# Patient Record
Sex: Male | Born: 1950 | Race: White | Hispanic: No | Smoking: Former smoker
Health system: Southern US, Community
[De-identification: ages and names within clinical notes are randomized; demographics above are authoritative.]

## PROBLEM LIST (undated history)

## (undated) DIAGNOSIS — K219 Gastro-esophageal reflux disease without esophagitis: Secondary | ICD-10-CM

## (undated) DIAGNOSIS — E785 Hyperlipidemia, unspecified: Secondary | ICD-10-CM

## (undated) DIAGNOSIS — I10 Essential (primary) hypertension: Secondary | ICD-10-CM

## (undated) DIAGNOSIS — E119 Type 2 diabetes mellitus without complications: Secondary | ICD-10-CM

## (undated) DIAGNOSIS — R7303 Prediabetes: Secondary | ICD-10-CM

## (undated) HISTORY — PX: COLONOSCOPY: SHX174

## (undated) HISTORY — PX: JOINT REPLACEMENT: SHX530

## (undated) HISTORY — PX: SHOULDER SURGERY: SHX246

## (undated) HISTORY — PX: ESOPHAGOGASTRODUODENOSCOPY: SHX1529

## (undated) HISTORY — PX: FRACTURE SURGERY: SHX138

---
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
## 2006-10-05 ENCOUNTER — Ambulatory Visit: Payer: Self-pay

## 2006-11-02 ENCOUNTER — Ambulatory Visit: Payer: Self-pay | Admitting: Orthopaedic Surgery

## 2012-04-02 ENCOUNTER — Emergency Department: Payer: Self-pay | Admitting: Emergency Medicine

## 2012-04-14 ENCOUNTER — Emergency Department: Payer: Self-pay | Admitting: Emergency Medicine

## 2013-10-12 ENCOUNTER — Ambulatory Visit: Payer: Self-pay | Admitting: Gastroenterology

## 2013-10-29 ENCOUNTER — Ambulatory Visit: Payer: Self-pay | Admitting: Gastroenterology

## 2013-12-27 HISTORY — PX: JOINT REPLACEMENT: SHX530

## 2014-01-25 ENCOUNTER — Ambulatory Visit: Payer: Self-pay | Admitting: Gastroenterology

## 2014-01-30 LAB — PATHOLOGY REPORT

## 2014-03-27 ENCOUNTER — Ambulatory Visit: Payer: Self-pay | Admitting: Orthopedic Surgery

## 2014-03-27 DIAGNOSIS — I10 Essential (primary) hypertension: Secondary | ICD-10-CM

## 2014-03-27 LAB — MRSA PCR SCREENING

## 2014-04-11 ENCOUNTER — Inpatient Hospital Stay: Payer: Self-pay | Admitting: Orthopedic Surgery

## 2014-04-12 LAB — BASIC METABOLIC PANEL
Anion Gap: 4 — ABNORMAL LOW (ref 7–16)
BUN: 12 mg/dL (ref 7–18)
CO2: 29 mmol/L (ref 21–32)
CREATININE: 1.11 mg/dL (ref 0.60–1.30)
Calcium, Total: 7.9 mg/dL — ABNORMAL LOW (ref 8.5–10.1)
Chloride: 100 mmol/L (ref 98–107)
EGFR (Non-African Amer.): >60
GLUCOSE: 148 mg/dL — AB (ref 65–99)
Osmolality: 269 (ref 275–301)
POTASSIUM: 3.7 mmol/L (ref 3.5–5.1)
SODIUM: 133 mmol/L — AB (ref 136–145)

## 2014-04-12 LAB — PLATELET COUNT: Platelet: 204 10*3/uL (ref 150–440)

## 2014-04-12 LAB — HEMOGLOBIN: HGB: 11.9 g/dL — AB (ref 13.0–18.0)

## 2014-04-13 LAB — BASIC METABOLIC PANEL
Anion Gap: 3 — ABNORMAL LOW (ref 7–16)
BUN: 16 mg/dL (ref 7–18)
CREATININE: 1.01 mg/dL (ref 0.60–1.30)
Calcium, Total: 7.8 mg/dL — ABNORMAL LOW (ref 8.5–10.1)
Chloride: 103 mmol/L (ref 98–107)
Co2: 31 mmol/L (ref 21–32)
EGFR (African American): >60
EGFR (Non-African Amer.): >60
GLUCOSE: 121 mg/dL — AB (ref 65–99)
OSMOLALITY: 276 (ref 275–301)
POTASSIUM: 3.9 mmol/L (ref 3.5–5.1)
Sodium: 137 mmol/L (ref 136–145)

## 2014-04-13 LAB — HEMOGLOBIN: HGB: 11.5 g/dL — ABNORMAL LOW (ref 13.0–18.0)

## 2014-04-17 LAB — PATHOLOGY REPORT

## 2015-04-19 NOTE — Discharge Summary (Signed)
PATIENT NAME:  Mark CavalierGRAHAM, Jabier M MR#:  [id number] DATE OF BIRTH:  February 18, 1951  DATE OF ADMISSION:  04/11/2014 DATE OF DISCHARGE:    ADMITTING DIAGNOSIS: Degenerative arthrosis of the right hip.   DISCHARGE DIAGNOSIS: Degenerative arthrosis of the right hip.   HISTORY OF PRESENT ILLNESS: The patient is a 64 year old who has been followed at [facility] for progression of right hip and groin pain. The patient had been having problems sleeping. Was having problems getting in and out of his car. He states that the pain had increased to the point that it was significantly interfering with his activities of daily living. [facility] showed severe arthritic changes to the right hip with collapse of the articular space. After discussion of the risks and benefits of surgical intervention, the patient expressed his understanding of the risks and benefits and agreed for plans for surgical intervention.   PROCEDURE: Right anterior total hip arthroplasty.   ANESTHESIA: Spinal.   IMPLANTS UTILIZED: Medacta AMIS 3-stem, 58 Versafit cup DM with liner, and a 38 mm femoral head.   HOSPITAL COURSE: The patient tolerated the procedure very well. He had no complications. He was then taken to the PAC-U, where he was stabilized and then transferred to the orthopedic floor. The patient began receiving anticoagulation therapy with Lovenox 30 mg subcu q.12 h. per anesthesia and pharmacy protocol. He was fitted with TED stockings bilaterally. These were allowed to be removed one hour per eight hour shift. He was also fitted with the AV acupressure foot pumps bilaterally, set at 80 mmHg. His calves have been nontender. There has been no evidence of any DVTs. Negative Homans sign. Heels were elevated off the bed using rolled towels.   The patient has denied any chest pain or shortness of breath. Vital signs have been stable. He has been afebrile. Hemodynamically, he was stable. No transfusions  were given.   Physical therapy was initiated on day 1 for gait training and transfers. Upon being discharged, he was ambulating greater than 340 feet. Was able go up and down four steps. He was independent with bed-to-chair transfers. Occupational therapy was also initiated on day 1 for activities of daily living and assistive devices.   The patient's IV, Foley and Hemovac were discontinued on day 2 along with the dressing change. The wound was free of any drainage or signs of infection.   DISPOSITION: The patient is being discharged to home in improved, stable condition. He will continue weight-bearing as tolerated. Anterior hip precautions were gone over. I recommend he continues rolled towels under his heels. Continue TED stockings. These are allowed to be removed at night, but are to be worn during the day. Recommend that he continue with incentive spirometer q.1 h. while awake, encourage cough, deep breathing q.2 h. while awake. He is placed on a regular diet. Staples will be removed on 04/25/2014, and apply Benzoin and Steri-Strips. He will need to see me in the office in six weeks. Call the clinic sooner for any temperatures of 101.5 or greater or excessive bleeding.   The patient is to resume his regular medications that he was on prior to admission. He was given a prescription for oxycodone 5 to 10 mg hours p.r.n. for pain, Ultram 50, 100 mg q.4-6 h. p.r.n. for pain and Lovenox 40 mg subcutaneously daily for 14 days, and then discontinue, begin taking one 81 mg enteric-coated aspirin unless contraindicated.   DRUG ALLERGIES: No known drug allergies.   PAST  MEDICAL HISTORY: Hypertension, hypercholesterolemia.     ____________________________ Van Clines, PA jrw:cg D: 04/14/2014 07:43:57 ET T: 04/14/2014 08:04:08 ET JOB#: [id number]  cc: Van Clines, PA, <Dictator> Sunya Humbarger PA ELECTRONICALLY SIGNED 04/19/2014 8:18

## 2015-04-19 NOTE — Op Note (Signed)
PATIENT NAME:  Mark Fisher, Mark Fisher MR#:  [id number] DATE OF BIRTH:  April 01, 1951  DATE OF PROCEDURE:  04/11/2014  PREOPERATIVE DIAGNOSIS: Right hip osteoarthritis.   POSTOPERATIVE DIAGNOSIS: Right hip osteoarthritis.  PROCEDURE: Right total hip replacement.   ANESTHESIA: Spinal.   SURGEON: Kennedy BuckerMichael Ashyia Schraeder, Fisher.D.   ASSISTANT: Dedra Skeensodd Mundy, PA-C.   DESCRIPTION OF PROCEDURE: The patient was brought to the Operating Room, and after spinal anesthesia was obtained, the patient was placed on the operative table with the Mitek attachment to the right foot and the left leg in a well-padded table. After getting  the C-arm in and getting good visualization of the hip. The hip was prepped and draped in the usual sterile fashion, appropriate patient identification, timeout procedures were completed. An anterior approach was made. Direct anterior hip approach using the AMIS technique with the incision centered over the greater trochanter and tensor fascia lata muscle. The TFL fascia was incised and the muscle retracted laterally. The lateral circumflex vessels were cauterized and the anterior hip capsule exposed with a flap created with the anterior capsule. The femoral neck cut was then carried out more vertical than usual because of the patient's inherent anatomy short varus femoral neck. After the neck cut was created. The head was removed and it showed complete loss of articular cartilage on the medial half of the head. The labrum was excised and sequential reaming was carried out to 58 mm, at which point, there was good bleeding bone around the periphery. A 58 mm trial fit well and a 58 cup was impacted in the appropriate position. With external rotation the ischiofemoral and pubofemoral ligaments were released to allow for adequate mobilization. The leg was dropped into extension. Sequential broaching was carried out to a #3, which gave a good fill. With trials appropriate leg length was determined, and the final  components selected. A 3 Amis stem was impacted and trial with a medium head was performed. Comparing this to preop x-rays it showed slightly longer hip as desired and the hip final components were assembled. After thorough irrigation of the hip, the hip was reduced, stable to 90 degrees external rotation test. The deep fascia was repaired using a heavy quill suture. A subcutaneous  drain placed, followed by the 2-0 quill subcutaneously and skin staples. Xeroform, 4 x 4's, ABD and tape applied. The patient was sent to recovery room in stable condition. There was 2 Hemovac  drains placed subcutaneously.   COMPLICATIONS: None.   SPECIMENS: The femoral head.   IMPLANTS: Medacta Amis 3 stem, 58 Versafit cup DM with liner, and 28 mm femoral head.     ____________________________ Leitha SchullerMichael J. Kwesi Sangha, MD mjm:sg D: 04/11/2014 09:46:01 ET T: 04/11/2014 10:23:25 ET JOB#: [id number]  cc: Leitha SchullerMichael J. Oneal Biglow, MD, <Dictator> Leitha SchullerMICHAEL J Dick Hark MD ELECTRONICALLY SIGNED 04/11/2014 13:56

## 2016-04-14 IMAGING — CR RIGHT HIP - COMPLETE 2+ VIEW
1 series · 2 of 2 positions shown · non-contrast
Comparison: 04/11/2014

CLINICAL DATA: Postop for total hip replacement

EXAM:
RIGHT HIP - COMPLETE 2+ VIEW

[Series 1: ap · 0.17mm/px · 2 of 2 slices shown]
[im 1/2]
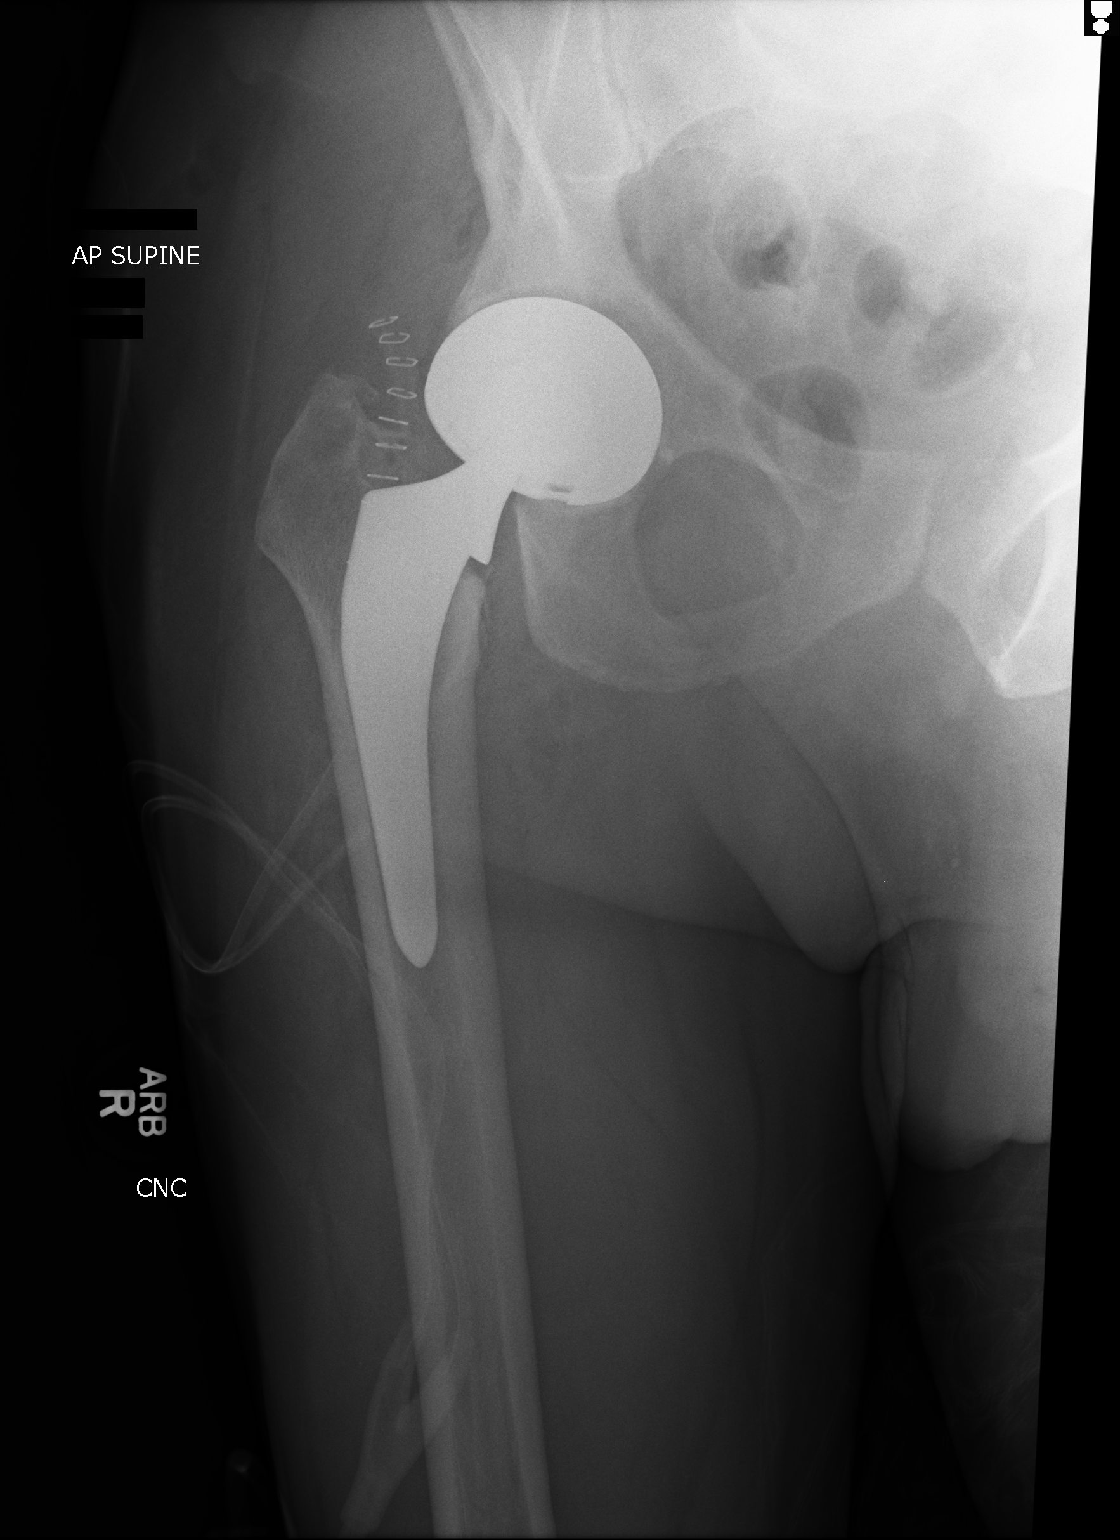
[im 2/2]
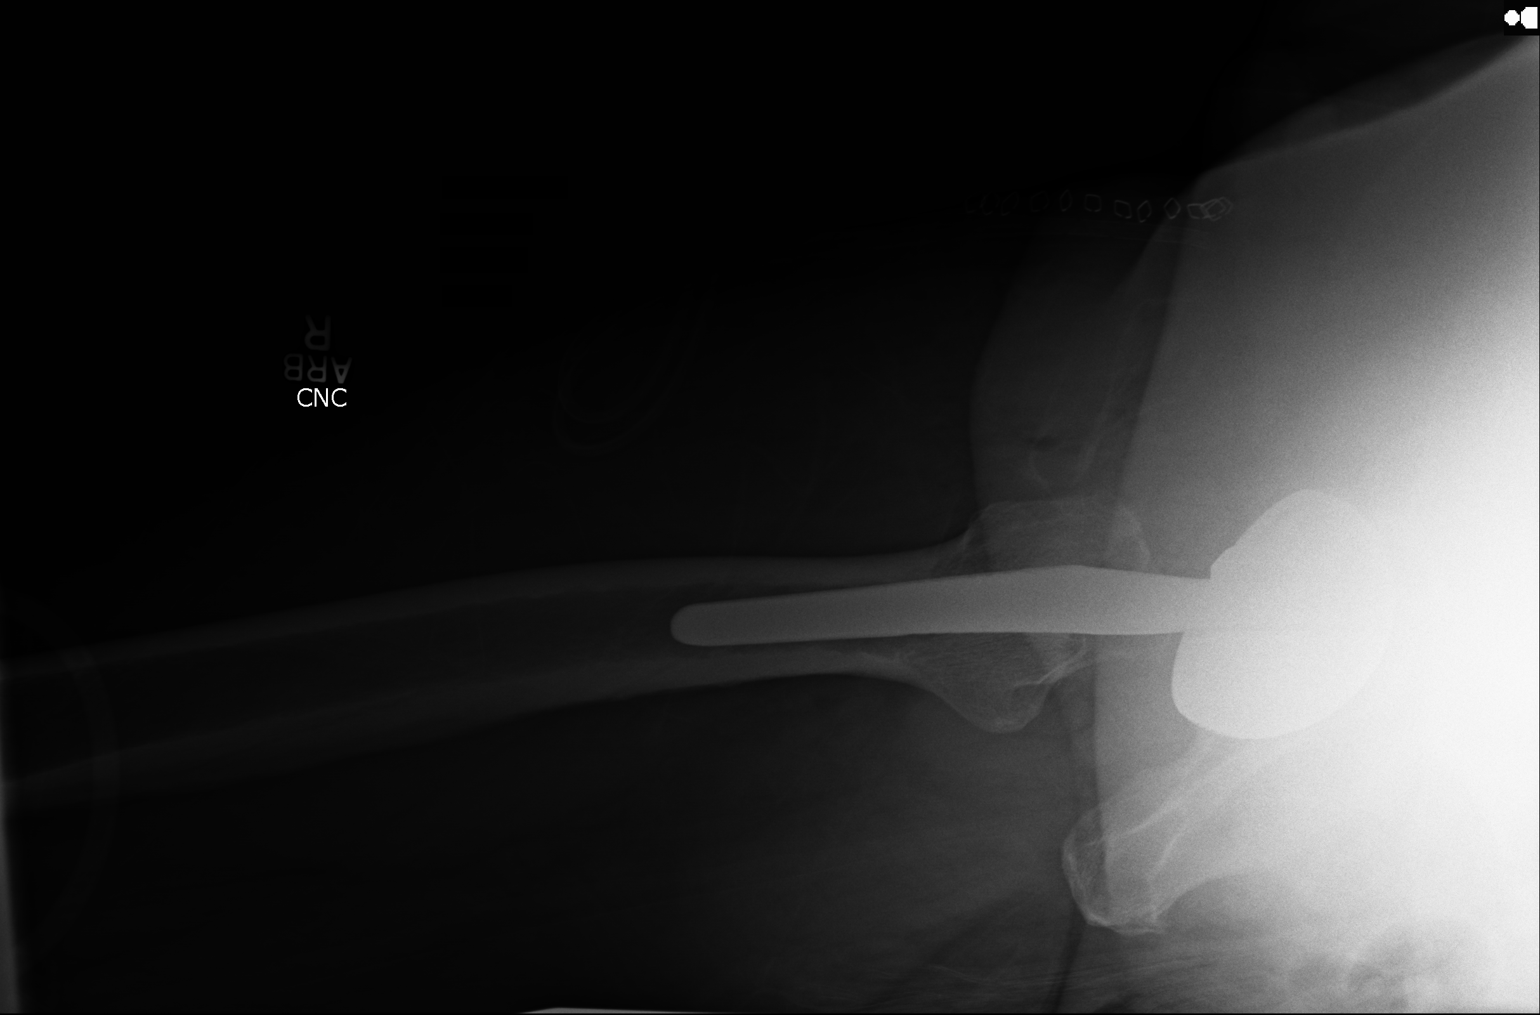

[2 of 2 positions shown; findings below may reference images not displayed]

FINDINGS: Two views of the right hip submitted. There is right hip prosthesis
in anatomic alignment. Postsurgical changes are noted with lateral
skin staple. Surgical drain is in place.
IMPRESSION: Right hip prosthesis in anatomic alignment. Postsurgical changes are
noted.

## 2017-01-20 ENCOUNTER — Other Ambulatory Visit: Payer: Self-pay | Admitting: Orthopedic Surgery

## 2017-01-20 DIAGNOSIS — M25512 Pain in left shoulder: Secondary | ICD-10-CM

## 2017-01-20 DIAGNOSIS — M25312 Other instability, left shoulder: Secondary | ICD-10-CM

## 2017-01-20 DIAGNOSIS — S46002A Unspecified injury of muscle(s) and tendon(s) of the rotator cuff of left shoulder, initial encounter: Secondary | ICD-10-CM

## 2017-01-26 ENCOUNTER — Ambulatory Visit
Admission: RE | Admit: 2017-01-26 | Discharge: 2017-01-26 | Disposition: A | Discharge status: Home / Self Care | Payer: 59 | Source: Ambulatory Visit | Attending: Orthopedic Surgery | Admitting: Orthopedic Surgery

## 2017-01-26 DIAGNOSIS — M25412 Effusion, left shoulder: Secondary | ICD-10-CM | POA: Diagnosis not present

## 2017-01-26 DIAGNOSIS — Z8781 Personal history of (healed) traumatic fracture: Secondary | ICD-10-CM | POA: Diagnosis not present

## 2017-01-26 DIAGNOSIS — Z9889 Other specified postprocedural states: Secondary | ICD-10-CM | POA: Insufficient documentation

## 2017-01-26 DIAGNOSIS — M75122 Complete rotator cuff tear or rupture of left shoulder, not specified as traumatic: Secondary | ICD-10-CM | POA: Insufficient documentation

## 2017-01-26 DIAGNOSIS — X58XXXA Exposure to other specified factors, initial encounter: Secondary | ICD-10-CM | POA: Diagnosis not present

## 2017-01-26 DIAGNOSIS — M25312 Other instability, left shoulder: Secondary | ICD-10-CM | POA: Diagnosis not present

## 2017-01-26 DIAGNOSIS — M25512 Pain in left shoulder: Secondary | ICD-10-CM | POA: Diagnosis present

## 2017-01-26 DIAGNOSIS — S46002A Unspecified injury of muscle(s) and tendon(s) of the rotator cuff of left shoulder, initial encounter: Secondary | ICD-10-CM

## 2017-02-16 ENCOUNTER — Encounter
Admission: RE | Admit: 2017-02-16 | Discharge: 2017-02-16 | Disposition: A | Discharge status: Home / Self Care | Payer: 59 | Source: Ambulatory Visit | Attending: Surgery | Admitting: Surgery

## 2017-02-16 DIAGNOSIS — Z01810 Encounter for preprocedural cardiovascular examination: Secondary | ICD-10-CM | POA: Insufficient documentation

## 2017-02-16 DIAGNOSIS — Z01812 Encounter for preprocedural laboratory examination: Secondary | ICD-10-CM

## 2017-02-16 DIAGNOSIS — E785 Hyperlipidemia, unspecified: Secondary | ICD-10-CM

## 2017-02-16 DIAGNOSIS — I10 Essential (primary) hypertension: Secondary | ICD-10-CM

## 2017-02-16 HISTORY — DX: Prediabetes: R73.03

## 2017-02-16 HISTORY — DX: Essential (primary) hypertension: I10

## 2017-02-16 HISTORY — DX: Hyperlipidemia, unspecified: E78.5

## 2017-02-16 HISTORY — DX: Gastro-esophageal reflux disease without esophagitis: K21.9

## 2017-02-16 LAB — URINALYSIS, COMPLETE (UACMP) WITH MICROSCOPIC
BACTERIA UA: NONE SEEN
BILIRUBIN URINE: NEGATIVE
Glucose, UA: NEGATIVE mg/dL
HGB URINE DIPSTICK: NEGATIVE
KETONES UR: NEGATIVE mg/dL
LEUKOCYTES UA: NEGATIVE
NITRITE: NEGATIVE
PROTEIN: NEGATIVE mg/dL
SQUAMOUS EPITHELIAL / LPF: NONE SEEN
Specific Gravity, Urine: 1.01 (ref 1.005–1.030)
pH: 5 (ref 5.0–8.0)

## 2017-02-16 LAB — TYPE AND SCREEN
ABO/RH(D): A POS
Antibody Screen: NEGATIVE

## 2017-02-16 LAB — BASIC METABOLIC PANEL
Anion gap: 6 (ref 5–15)
BUN: 15 mg/dL (ref 6–20)
CO2: 27 mmol/L (ref 22–32)
CREATININE: 0.88 mg/dL (ref 0.61–1.24)
Calcium: 9.1 mg/dL (ref 8.9–10.3)
Chloride: 106 mmol/L (ref 101–111)
Glucose, Bld: 92 mg/dL (ref 65–99)
POTASSIUM: 3.9 mmol/L (ref 3.5–5.1)
SODIUM: 139 mmol/L (ref 135–145)

## 2017-02-16 LAB — PROTIME-INR
INR: 1.03
PROTHROMBIN TIME: 13.5 s (ref 11.4–15.2)

## 2017-02-16 MED ORDER — CEFAZOLIN SODIUM-DEXTROSE 2-4 GM/100ML-% IV SOLN
2.0000 g | Freq: Once | INTRAVENOUS | Status: AC
  Administered 2017-02-17: 2 g via INTRAVENOUS

## 2017-02-16 NOTE — Patient Instructions (Signed)
Your procedure is scheduled on: Thursday 02/17/17 Report to DAY SURGERY. 2ND FLOOR MEDICAL MALL ENTRANCE. 10:30 arrival   Remember: Instructions that are not followed completely may result in serious medical risk, up to and including death, or upon the discretion of your surgeon and anesthesiologist your surgery may need to be rescheduled.    __X__ 1. Do not eat food or drink liquids after midnight. No gum chewing or hard candies.     __X__ 2. No Alcohol for 24 hours before or after surgery.   ____ 3. Bring all medications with you on the day of surgery if instructed.    __X__ 4. Notify your doctor if there is any change in your medical condition     (cold, fever, infections).             ___x__5. No smoking within 24 hours of your surgery.     Do not wear jewelry, make-up, hairpins, clips or nail polish.  Do not wear lotions, powders, or perfumes.   Do not shave 48 hours prior to surgery. Men may shave face and neck.  Do not bring valuables to the hospital.    [facility] not responsible for any belongings or valuables.               Contacts, dentures or bridgework may not be worn into surgery.  Leave your suitcase in the car. After surgery it may be brought to your room.  For patients admitted to the hospital, discharge time is determined by your                treatment team.   Patients discharged the day of surgery will not be allowed to drive home.   Please read over the following fact sheets that you were given:   MRSA Information   __x__ Take these medicines the morning of surgery with A SIP OF WATER:    1. protonix  2.   3.   4.  5.  6.  ____ Fleet Enema (as directed)   __x__ Use CHG Soap as directed  ____ Use inhalers on the day of surgery  ____ Stop metformin 2 days prior to surgery    ____ Take 1/2 of usual insulin dose the night before surgery and none on the morning of surgery.   ____ Stop Coumadin/Plavix/aspirin on   __X__ Stop Anti-inflammatories  such as Advil, Aleve, Ibuprofen, Motrin, Naproxen, Naprosyn, Goodies,powder, or aspirin products.  OK to take Tylenol.   ____ Stop supplements until after surgery.    ____ Bring C-Pap to the hospital.

## 2017-02-17 ENCOUNTER — Ambulatory Visit: Payer: 59 | Admitting: Anesthesiology

## 2017-02-17 ENCOUNTER — Inpatient Hospital Stay
Admission: AD | Admit: 2017-02-17 | Discharge: 2017-02-18 | DRG: 483 | Disposition: A | Discharge status: Home Health | Payer: 59 | Source: Ambulatory Visit | Attending: Surgery | Admitting: Surgery

## 2017-02-17 ENCOUNTER — Inpatient Hospital Stay: Payer: 59

## 2017-02-17 ENCOUNTER — Encounter
Admission: AD | Disposition: A | Discharge status: Home Health | Payer: Self-pay | Source: Ambulatory Visit | Attending: Surgery

## 2017-02-17 DIAGNOSIS — Z79899 Other long term (current) drug therapy: Secondary | ICD-10-CM | POA: Diagnosis not present

## 2017-02-17 DIAGNOSIS — R7303 Prediabetes: Secondary | ICD-10-CM | POA: Diagnosis present

## 2017-02-17 DIAGNOSIS — M75102 Unspecified rotator cuff tear or rupture of left shoulder, not specified as traumatic: Principal | ICD-10-CM | POA: Diagnosis present

## 2017-02-17 DIAGNOSIS — M12812 Other specific arthropathies, not elsewhere classified, left shoulder: Secondary | ICD-10-CM | POA: Diagnosis present

## 2017-02-17 DIAGNOSIS — K219 Gastro-esophageal reflux disease without esophagitis: Secondary | ICD-10-CM | POA: Diagnosis present

## 2017-02-17 DIAGNOSIS — E785 Hyperlipidemia, unspecified: Secondary | ICD-10-CM | POA: Diagnosis present

## 2017-02-17 DIAGNOSIS — I10 Essential (primary) hypertension: Secondary | ICD-10-CM | POA: Diagnosis present

## 2017-02-17 DIAGNOSIS — Z87891 Personal history of nicotine dependence: Secondary | ICD-10-CM | POA: Diagnosis not present

## 2017-02-17 DIAGNOSIS — Z7982 Long term (current) use of aspirin: Secondary | ICD-10-CM

## 2017-02-17 DIAGNOSIS — Z96612 Presence of left artificial shoulder joint: Secondary | ICD-10-CM

## 2017-02-17 DIAGNOSIS — Z96641 Presence of right artificial hip joint: Secondary | ICD-10-CM | POA: Diagnosis present

## 2017-02-17 HISTORY — PX: REVERSE SHOULDER ARTHROPLASTY: SHX5054

## 2017-02-17 LAB — URINE CULTURE: CULTURE: NO GROWTH

## 2017-02-17 LAB — SURGICAL PCR SCREEN
MRSA, PCR: NEGATIVE
Staphylococcus aureus: NEGATIVE

## 2017-02-17 LAB — GLUCOSE, CAPILLARY: Glucose-Capillary: 111 mg/dL — ABNORMAL HIGH (ref 65–99)

## 2017-02-17 LAB — ABO/RH: ABO/RH(D): A POS

## 2017-02-17 SURGERY — ARTHROPLASTY, SHOULDER, TOTAL, REVERSE
Anesthesia: Regional | Site: Shoulder | Laterality: Left | Wound class: Clean

## 2017-02-17 MED ORDER — ONDANSETRON HCL 4 MG/2ML IJ SOLN
INTRAMUSCULAR | Status: AC
  Filled 2017-02-17: qty 2

## 2017-02-17 MED ORDER — ACETAMINOPHEN NICU IV SYRINGE 10 MG/ML
INTRAVENOUS | Status: AC
  Filled 2017-02-17: qty 1

## 2017-02-17 MED ORDER — DOCUSATE SODIUM 100 MG PO CAPS
100.0000 mg | ORAL_CAPSULE | Freq: Two times a day (BID) | ORAL | Status: DC
  Administered 2017-02-17 – 2017-02-18 (×2): 100 mg via ORAL
  Filled 2017-02-17 (×2): qty 1

## 2017-02-17 MED ORDER — BUPIVACAINE-EPINEPHRINE (PF) 0.5% -1:200000 IJ SOLN
INTRAMUSCULAR | Status: AC
  Filled 2017-02-17: qty 30

## 2017-02-17 MED ORDER — ACETAMINOPHEN 500 MG PO TABS
1000.0000 mg | ORAL_TABLET | Freq: Four times a day (QID) | ORAL | Status: DC
  Administered 2017-02-17 – 2017-02-18 (×3): 1000 mg via ORAL
  Filled 2017-02-17 (×3): qty 2

## 2017-02-17 MED ORDER — SUGAMMADEX SODIUM 200 MG/2ML IV SOLN
INTRAVENOUS | Status: DC | PRN
  Administered 2017-02-17: 180 mg via INTRAVENOUS

## 2017-02-17 MED ORDER — BISACODYL 10 MG RE SUPP: 10.0000 mg | Freq: Every day | RECTAL | Status: DC | PRN

## 2017-02-17 MED ORDER — EPHEDRINE SULFATE 50 MG/ML IJ SOLN
INTRAMUSCULAR | Status: DC | PRN
  Administered 2017-02-17 (×3): 5 mg via INTRAVENOUS

## 2017-02-17 MED ORDER — MIDAZOLAM HCL 2 MG/2ML IJ SOLN
INTRAMUSCULAR | Status: AC
  Administered 2017-02-17: 1 mg via INTRAVENOUS
  Filled 2017-02-17: qty 2

## 2017-02-17 MED ORDER — NEOMYCIN-POLYMYXIN B GU 40-200000 IR SOLN
Status: DC | PRN
  Administered 2017-02-17: 16 mL

## 2017-02-17 MED ORDER — ACETAMINOPHEN 325 MG PO TABS: 650.0000 mg | ORAL_TABLET | Freq: Four times a day (QID) | ORAL | Status: DC | PRN

## 2017-02-17 MED ORDER — SODIUM CHLORIDE 0.9 % IJ SOLN
INTRAMUSCULAR | Status: AC
  Filled 2017-02-17: qty 50

## 2017-02-17 MED ORDER — SUGAMMADEX SODIUM 200 MG/2ML IV SOLN
INTRAVENOUS | Status: AC
  Filled 2017-02-17: qty 2

## 2017-02-17 MED ORDER — ROPIVACAINE HCL 5 MG/ML IJ SOLN
INTRAMUSCULAR | Status: DC | PRN
  Administered 2017-02-17: 30 mL via PERINEURAL

## 2017-02-17 MED ORDER — ONDANSETRON HCL 4 MG PO TABS: 4.0000 mg | ORAL_TABLET | Freq: Four times a day (QID) | ORAL | Status: DC | PRN

## 2017-02-17 MED ORDER — PROPOFOL 10 MG/ML IV BOLUS
INTRAVENOUS | Status: AC
  Filled 2017-02-17: qty 20

## 2017-02-17 MED ORDER — LISINOPRIL-HYDROCHLOROTHIAZIDE 20-25 MG PO TABS: 1.0000 | ORAL_TABLET | Freq: Every day | ORAL | Status: DC

## 2017-02-17 MED ORDER — EPINEPHRINE PF 1 MG/ML IJ SOLN
INTRAMUSCULAR | Status: AC
  Filled 2017-02-17: qty 2

## 2017-02-17 MED ORDER — ACETAMINOPHEN 10 MG/ML IV SOLN
INTRAVENOUS | Status: DC | PRN
  Administered 2017-02-17: 1000 mg via INTRAVENOUS

## 2017-02-17 MED ORDER — KETOROLAC TROMETHAMINE 15 MG/ML IJ SOLN
15.0000 mg | Freq: Four times a day (QID) | INTRAMUSCULAR | Status: DC
  Administered 2017-02-17 – 2017-02-18 (×2): 15 mg via INTRAVENOUS
  Filled 2017-02-17 (×3): qty 1

## 2017-02-17 MED ORDER — LIDOCAINE HCL (PF) 1 % IJ SOLN
INTRAMUSCULAR | Status: DC | PRN
  Administered 2017-02-17: 5 mL via SUBCUTANEOUS

## 2017-02-17 MED ORDER — FLEET ENEMA 7-19 GM/118ML RE ENEM: 1.0000 | ENEMA | Freq: Once | RECTAL | Status: DC | PRN

## 2017-02-17 MED ORDER — TRANEXAMIC ACID 1000 MG/10ML IV SOLN
INTRAVENOUS | Status: AC
  Filled 2017-02-17: qty 10

## 2017-02-17 MED ORDER — CEFAZOLIN SODIUM-DEXTROSE 2-3 GM-% IV SOLR
2.0000 g | Freq: Four times a day (QID) | INTRAVENOUS | Status: AC
  Administered 2017-02-17 – 2017-02-18 (×3): 2 g via INTRAVENOUS
  Filled 2017-02-17 (×3): qty 50

## 2017-02-17 MED ORDER — MIDAZOLAM HCL 2 MG/2ML IJ SOLN
INTRAMUSCULAR | Status: AC
  Filled 2017-02-17: qty 2

## 2017-02-17 MED ORDER — LISINOPRIL 20 MG PO TABS
20.0000 mg | ORAL_TABLET | Freq: Every day | ORAL | Status: DC
  Administered 2017-02-17 – 2017-02-18 (×2): 20 mg via ORAL
  Filled 2017-02-17 (×3): qty 1

## 2017-02-17 MED ORDER — ROCURONIUM BROMIDE 50 MG/5ML IV SOLN
INTRAVENOUS | Status: AC
  Filled 2017-02-17: qty 1

## 2017-02-17 MED ORDER — ROCURONIUM BROMIDE 100 MG/10ML IV SOLN
INTRAVENOUS | Status: DC | PRN
  Administered 2017-02-17: 10 mg via INTRAVENOUS
  Administered 2017-02-17: 40 mg via INTRAVENOUS
  Administered 2017-02-17: 10 mg via INTRAVENOUS

## 2017-02-17 MED ORDER — CEFAZOLIN SODIUM-DEXTROSE 2-4 GM/100ML-% IV SOLN: 2.0000 g | Freq: Four times a day (QID) | INTRAVENOUS | Status: DC

## 2017-02-17 MED ORDER — HYDROCHLOROTHIAZIDE 25 MG PO TABS
25.0000 mg | ORAL_TABLET | Freq: Every day | ORAL | Status: DC
  Administered 2017-02-17 – 2017-02-18 (×2): 25 mg via ORAL
  Filled 2017-02-17 (×2): qty 1

## 2017-02-17 MED ORDER — DEXAMETHASONE SODIUM PHOSPHATE 10 MG/ML IJ SOLN
INTRAMUSCULAR | Status: DC | PRN
  Administered 2017-02-17: 10 mg via INTRAVENOUS

## 2017-02-17 MED ORDER — SODIUM CHLORIDE 0.9 % IV SOLN
INTRAVENOUS | Status: DC | PRN
  Administered 2017-02-17: 20 ug/min via INTRAVENOUS

## 2017-02-17 MED ORDER — OXYCODONE HCL 5 MG PO TABS: 5.0000 mg | ORAL_TABLET | ORAL | Status: DC | PRN

## 2017-02-17 MED ORDER — DIPHENHYDRAMINE HCL 12.5 MG/5ML PO ELIX: 12.5000 mg | ORAL_SOLUTION | ORAL | Status: DC | PRN

## 2017-02-17 MED ORDER — HYDROMORPHONE HCL 1 MG/ML IJ SOLN
1.0000 mg | INTRAMUSCULAR | Status: DC | PRN
  Administered 2017-02-17: 1 mg via INTRAVENOUS
  Filled 2017-02-17: qty 1

## 2017-02-17 MED ORDER — TRANEXAMIC ACID 1000 MG/10ML IV SOLN
INTRAVENOUS | Status: DC | PRN
  Administered 2017-02-17: 1000 mg

## 2017-02-17 MED ORDER — SODIUM CHLORIDE 0.9 % IV SOLN
INTRAVENOUS | Status: DC
  Administered 2017-02-17: 11:00:00 via INTRAVENOUS

## 2017-02-17 MED ORDER — ASPIRIN EC 81 MG PO TBEC
81.0000 mg | DELAYED_RELEASE_TABLET | Freq: Every day | ORAL | Status: DC
  Administered 2017-02-18: 81 mg via ORAL
  Filled 2017-02-17: qty 1

## 2017-02-17 MED ORDER — PRAVASTATIN SODIUM 20 MG PO TABS
20.0000 mg | ORAL_TABLET | Freq: Every day | ORAL | Status: DC
  Administered 2017-02-17: 20 mg via ORAL
  Filled 2017-02-17: qty 1

## 2017-02-17 MED ORDER — ACETAMINOPHEN 650 MG RE SUPP: 650.0000 mg | Freq: Four times a day (QID) | RECTAL | Status: DC | PRN

## 2017-02-17 MED ORDER — LIDOCAINE HCL (PF) 2 % IJ SOLN
INTRAMUSCULAR | Status: AC
  Filled 2017-02-17: qty 2

## 2017-02-17 MED ORDER — ONDANSETRON HCL 4 MG/2ML IJ SOLN
INTRAMUSCULAR | Status: DC | PRN
  Administered 2017-02-17: 4 mg via INTRAVENOUS

## 2017-02-17 MED ORDER — FENTANYL CITRATE (PF) 100 MCG/2ML IJ SOLN
INTRAMUSCULAR | Status: AC
  Filled 2017-02-17: qty 2

## 2017-02-17 MED ORDER — DEXAMETHASONE SODIUM PHOSPHATE 10 MG/ML IJ SOLN
INTRAMUSCULAR | Status: AC
  Filled 2017-02-17: qty 1

## 2017-02-17 MED ORDER — NEOMYCIN-POLYMYXIN B GU 40-200000 IR SOLN
Status: AC
  Filled 2017-02-17: qty 20

## 2017-02-17 MED ORDER — FENTANYL CITRATE (PF) 100 MCG/2ML IJ SOLN
INTRAMUSCULAR | Status: DC | PRN
  Administered 2017-02-17 (×4): 50 ug via INTRAVENOUS

## 2017-02-17 MED ORDER — MIDAZOLAM HCL 2 MG/2ML IJ SOLN
INTRAMUSCULAR | Status: DC | PRN
  Administered 2017-02-17: 2 mg via INTRAVENOUS

## 2017-02-17 MED ORDER — PHENYLEPHRINE HCL 10 MG/ML IJ SOLN
INTRAMUSCULAR | Status: DC | PRN
  Administered 2017-02-17: 150 ug via INTRAVENOUS
  Administered 2017-02-17 (×2): 100 ug via INTRAVENOUS

## 2017-02-17 MED ORDER — PROPOFOL 10 MG/ML IV BOLUS
INTRAVENOUS | Status: DC | PRN
  Administered 2017-02-17: 200 mg via INTRAVENOUS

## 2017-02-17 MED ORDER — PANTOPRAZOLE SODIUM 40 MG PO TBEC
40.0000 mg | DELAYED_RELEASE_TABLET | Freq: Every day | ORAL | Status: DC
  Administered 2017-02-18: 40 mg via ORAL
  Filled 2017-02-17: qty 1

## 2017-02-17 MED ORDER — FENTANYL CITRATE (PF) 100 MCG/2ML IJ SOLN
INTRAMUSCULAR | Status: AC
  Administered 2017-02-17: 50 ug via INTRAVENOUS
  Filled 2017-02-17: qty 2

## 2017-02-17 MED ORDER — ONDANSETRON HCL 4 MG/2ML IJ SOLN: 4.0000 mg | Freq: Four times a day (QID) | INTRAMUSCULAR | Status: DC | PRN

## 2017-02-17 MED ORDER — MAGNESIUM HYDROXIDE 400 MG/5ML PO SUSP: 30.0000 mL | Freq: Every day | ORAL | Status: DC | PRN

## 2017-02-17 MED ORDER — FENTANYL CITRATE (PF) 100 MCG/2ML IJ SOLN
50.0000 ug | Freq: Once | INTRAMUSCULAR | Status: AC
  Administered 2017-02-17: 50 ug via INTRAVENOUS

## 2017-02-17 MED ORDER — KETOROLAC TROMETHAMINE 30 MG/ML IJ SOLN: 30.0000 mg | Freq: Once | INTRAMUSCULAR | Status: DC

## 2017-02-17 MED ORDER — METOCLOPRAMIDE HCL 10 MG PO TABS: 5.0000 mg | ORAL_TABLET | Freq: Three times a day (TID) | ORAL | Status: DC | PRN

## 2017-02-17 MED ORDER — CEFAZOLIN SODIUM-DEXTROSE 2-4 GM/100ML-% IV SOLN
INTRAVENOUS | Status: AC
  Filled 2017-02-17: qty 100

## 2017-02-17 MED ORDER — BUPIVACAINE-EPINEPHRINE (PF) 0.5% -1:200000 IJ SOLN
INTRAMUSCULAR | Status: DC | PRN
  Administered 2017-02-17: 30 mL

## 2017-02-17 MED ORDER — FENTANYL CITRATE (PF) 100 MCG/2ML IJ SOLN: 25.0000 ug | INTRAMUSCULAR | Status: DC | PRN

## 2017-02-17 MED ORDER — MIDAZOLAM HCL 2 MG/2ML IJ SOLN
1.0000 mg | Freq: Once | INTRAMUSCULAR | Status: AC
  Administered 2017-02-17: 1 mg via INTRAVENOUS

## 2017-02-17 MED ORDER — ROPIVACAINE HCL 5 MG/ML IJ SOLN
INTRAMUSCULAR | Status: AC
  Filled 2017-02-17: qty 40

## 2017-02-17 MED ORDER — ENOXAPARIN SODIUM 40 MG/0.4ML ~~LOC~~ SOLN
40.0000 mg | SUBCUTANEOUS | Status: DC
  Administered 2017-02-18: 40 mg via SUBCUTANEOUS
  Filled 2017-02-17: qty 0.4

## 2017-02-17 MED ORDER — BUPIVACAINE LIPOSOME 1.3 % IJ SUSP
INTRAMUSCULAR | Status: AC
  Filled 2017-02-17: qty 20

## 2017-02-17 MED ORDER — ONDANSETRON HCL 4 MG/2ML IJ SOLN: 4.0000 mg | Freq: Once | INTRAMUSCULAR | Status: DC | PRN

## 2017-02-17 MED ORDER — LIDOCAINE HCL (CARDIAC) 20 MG/ML IV SOLN
INTRAVENOUS | Status: DC | PRN
  Administered 2017-02-17: 80 mg via INTRAVENOUS

## 2017-02-17 MED ORDER — LIDOCAINE HCL (PF) 1 % IJ SOLN
INTRAMUSCULAR | Status: AC
  Filled 2017-02-17: qty 5

## 2017-02-17 MED ORDER — METOCLOPRAMIDE HCL 5 MG/ML IJ SOLN: 5.0000 mg | Freq: Three times a day (TID) | INTRAMUSCULAR | Status: DC | PRN

## 2017-02-17 MED ORDER — SODIUM CHLORIDE 0.9 % IV SOLN
INTRAVENOUS | Status: DC | PRN
  Administered 2017-02-17: 60 mL

## 2017-02-17 MED ORDER — SODIUM CHLORIDE 0.9 % IJ SOLN: INTRAMUSCULAR | Status: DC | PRN

## 2017-02-17 MED ORDER — KCL IN DEXTROSE-NACL 20-5-0.9 MEQ/L-%-% IV SOLN
INTRAVENOUS | Status: DC
  Administered 2017-02-17 – 2017-02-18 (×2): via INTRAVENOUS
  Filled 2017-02-17 (×4): qty 1000

## 2017-02-17 SURGICAL SUPPLY — 60 items
BIT DRILL TWIST 2.7 (BIT) IMPLANT
BLADE SAGITTAL WIDE XTHICK NO (BLADE) ×2 IMPLANT
CANISTER SUCT 1200ML W/VALVE (MISCELLANEOUS) ×2 IMPLANT
CANISTER SUCT 3000ML PPV (MISCELLANEOUS) ×4 IMPLANT
CAPT SHLDR REVTOTAL 2 ×2 IMPLANT
CATH TRAY METER 16FR LF (MISCELLANEOUS) ×2 IMPLANT
CHLORAPREP W/TINT 26ML (MISCELLANEOUS) ×2 IMPLANT
COOLER POLAR GLACIER W/PUMP (MISCELLANEOUS) ×2 IMPLANT
CRADLE LAMINECT ARM (MISCELLANEOUS) ×2 IMPLANT
DRAPE IMP U-DRAPE 54X76 (DRAPES) ×4 IMPLANT
DRAPE INCISE IOBAN 66X45 STRL (DRAPES) ×4 IMPLANT
DRAPE INCISE IOBAN 66X60 STRL (DRAPES) ×2 IMPLANT
DRAPE SHEET LG 3/4 BI-LAMINATE (DRAPES) ×4 IMPLANT
DRAPE TABLE BACK 80X90 (DRAPES) ×2 IMPLANT
DRSG OPSITE POSTOP 4X8 (GAUZE/BANDAGES/DRESSINGS) ×2 IMPLANT
ELECT CAUTERY BLADE 6.4 (BLADE) ×2 IMPLANT
GLOVE BIO SURGEON STRL SZ7.5 (GLOVE) ×8 IMPLANT
GLOVE BIO SURGEON STRL SZ8 (GLOVE) ×8 IMPLANT
GLOVE BIOGEL PI IND STRL 8 (GLOVE) ×5 IMPLANT
GLOVE BIOGEL PI INDICATOR 8 (GLOVE) ×5
GLOVE INDICATOR 8.0 STRL GRN (GLOVE) ×2 IMPLANT
GOWN STRL REUS W/ TWL LRG LVL3 (GOWN DISPOSABLE) ×1 IMPLANT
GOWN STRL REUS W/ TWL XL LVL3 (GOWN DISPOSABLE) ×1 IMPLANT
GOWN STRL REUS W/TWL LRG LVL3 (GOWN DISPOSABLE) ×1
GOWN STRL REUS W/TWL XL LVL3 (GOWN DISPOSABLE) ×1
HANDPIECE INTERPULSE COAX TIP (DISPOSABLE) ×1
HOOD PEEL AWAY FLYTE STAYCOOL (MISCELLANEOUS) ×6 IMPLANT
KIT RM TURNOVER STRD PROC AR (KITS) ×2 IMPLANT
KIT STABILIZATION SHOULDER (MISCELLANEOUS) ×2 IMPLANT
MASK FACE SPIDER DISP (MASK) ×2 IMPLANT
NDL MAYO CATGUT SZ1 (NEEDLE) ×2
NDL MAYO CATGUT SZ5 (NEEDLE) ×1
NDL SAFETY 22GX1.5 (NEEDLE) ×2 IMPLANT
NDL SUT 5 .5 CRC TPR PNT MAYO (NEEDLE) ×1 IMPLANT
NEEDLE 18GX1X1/2 (RX/OR ONLY) (NEEDLE) ×2 IMPLANT
NEEDLE HYPO 25X1 1.5 SAFETY (NEEDLE) ×2 IMPLANT
NEEDLE MAYO CATGUT SZ1 (NEEDLE) ×1 IMPLANT
NEEDLE MAYO CATGUT SZ4 (NEEDLE) ×2 IMPLANT
NEEDLE SPNL 20GX3.5 QUINCKE YW (NEEDLE) ×2 IMPLANT
NS IRRIG 1000ML POUR BTL (IV SOLUTION) ×2 IMPLANT
PACK ARTHROSCOPY SHOULDER (MISCELLANEOUS) ×2 IMPLANT
PAD WRAPON POLAR SHDR UNIV (MISCELLANEOUS) ×1 IMPLANT
PIN THREADED REVERSE (PIN) IMPLANT
SET HNDPC FAN SPRY TIP SCT (DISPOSABLE) ×1 IMPLANT
SLING ULTRA II M (MISCELLANEOUS) ×2 IMPLANT
SOL .9 NS 3000ML IRR  AL (IV SOLUTION) ×1
SOL .9 NS 3000ML IRR UROMATIC (IV SOLUTION) ×1 IMPLANT
SPONGE LAP 18X18 5 PK (GAUZE/BANDAGES/DRESSINGS) ×2 IMPLANT
STAPLER SKIN PROX 35W (STAPLE) ×2 IMPLANT
SUT ETHIBOND 0 MO6 C/R (SUTURE) ×2 IMPLANT
SUT ETHIBOND NAB CT1 #1 30IN (SUTURE) ×2 IMPLANT
SUT FIBERWIRE #2 38 BLUE 1/2 (SUTURE) ×8
SUT VIC AB 0 CT1 36 (SUTURE) ×4 IMPLANT
SUT VIC AB 2-0 CT1 27 (SUTURE) ×2
SUT VIC AB 2-0 CT1 TAPERPNT 27 (SUTURE) ×2 IMPLANT
SUTURE FIBERWR #2 38 BLUE 1/2 (SUTURE) ×4 IMPLANT
SYR 30ML LL (SYRINGE) ×4 IMPLANT
SYRINGE 10CC LL (SYRINGE) ×2 IMPLANT
TUBE CONNECTING 6X3/16 (MISCELLANEOUS) ×2 IMPLANT
WRAPON POLAR PAD SHDR UNIV (MISCELLANEOUS) ×2

## 2017-02-17 NOTE — Anesthesia Procedure Notes (Signed)
Procedure Name: Intubation Date/Time: 02/17/2017 1:39 PM Performed by: Hedda Slade Pre-anesthesia Checklist: Patient identified, Patient being monitored, Timeout performed, Emergency Drugs available and Suction available Patient Re-evaluated:Patient Re-evaluated prior to inductionOxygen Delivery Method: Circle system utilized Preoxygenation: Pre-oxygenation with 100% oxygen Intubation Type: IV induction Ventilation: Mask ventilation without difficulty Laryngoscope Size: Mac and 4 Grade View: Grade I Tube type: Oral Tube size: 7.5 mm Number of attempts: 1 Airway Equipment and Method: Stylet Placement Confirmation: ETT inserted through vocal cords under direct vision,  positive ETCO2 and breath sounds checked- equal and bilateral Secured at: 22 cm Tube secured with: Tape Dental Injury: Teeth and Oropharynx as per pre-operative assessment

## 2017-02-17 NOTE — Anesthesia Preprocedure Evaluation (Signed)
Anesthesia Evaluation  Patient identified by MRN, date of birth, ID band Patient awake    Reviewed: Allergy & Precautions, H&P , NPO status , Patient's Chart, lab work & pertinent test results, reviewed documented beta blocker date and time   History of Anesthesia Complications Negative for: history of anesthetic complications  Airway Mallampati: I  TM Distance: >3 FB Neck ROM: full    Dental  (+) Caps, Teeth Intact   Pulmonary neg pulmonary ROS, former smoker,           Cardiovascular Exercise Tolerance: Good hypertension, On Medications (-) angina(-) CAD, (-) Past MI, (-) Cardiac Stents and (-) CABG (-) dysrhythmias (-) Valvular Problems/Murmurs     Neuro/Psych negative neurological ROS  negative psych ROS   GI/Hepatic Neg liver ROS, GERD  Medicated and Controlled,  Endo/Other  diabetes (Borderline)  Renal/GU negative Renal ROS  negative genitourinary   Musculoskeletal   Abdominal   Peds  Hematology negative hematology ROS (+)   Anesthesia Other Findings Past Medical History: No date: GERD (gastroesophageal reflux disease) No date: HLD (hyperlipidemia) No date: Hypertension No date: Pre-diabetes   Reproductive/Obstetrics negative OB ROS                             Anesthesia Physical Anesthesia Plan  ASA: II  Anesthesia Plan: General and Regional   Post-op Pain Management: GA combined w/ Regional for post-op pain   Induction:   Airway Management Planned:   Additional Equipment:   Intra-op Plan:   Post-operative Plan:   Informed Consent: I have reviewed the patients History and Physical, chart, labs and discussed the procedure including the risks, benefits and alternatives for the proposed anesthesia with the patient or authorized representative who has indicated his/her understanding and acceptance.   Dental Advisory Given  Plan Discussed with: Anesthesiologist, CRNA  and Surgeon  Anesthesia Plan Comments:         Anesthesia Quick Evaluation

## 2017-02-17 NOTE — H&P (Signed)
Paper H&P to be scanned into permanent record. H&P reviewed. No changes. 

## 2017-02-17 NOTE — Op Note (Signed)
02/17/2017  3:36 PM  Patient:   Mark MUNDORF Sr.  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  Post-Op Diagnosis:   Same.  Procedure:   Reverse left total shoulder arthroplasty.  Surgeon:   Maryagnes Amos, MD  Assistant:   Horris Latino, PA-C  Anesthesia:   General endotracheal with an interscalene block placed preoperatively by the anesthesiologist.  Findings:   As above.  Complications:   None  EBL:  150 cc  Fluids:   1000 cc crystalloid  UOP:  None  TT:   None  Drains:   None  Closure:   Staples  Implants:   All press-fit Biomet Comprehensive system with a #8 mini-humeral stem, a 44 mm humeral tray with a standard insert, and a mini-base plate with a 36 mm glenosphere.  Brief Clinical Note:   The patient is a 66 year old male who is now 10 years status post a rotator cuff repair with persistent symptoms and weakness who apparently fell onto his left side several months ago. Since this fall, he has been unable to raise his arm even to shoulder level. His symptoms have persisted despite medications, activity modification, etc. His history and examination consistent with a massive rotator cuff tear. An MRI scan confirmed the presence of a massive irreparable rotator cuff tear with early cuff arthropathy. The patient presents at this time for a reverse left total shoulder arthroplasty.  Procedure:   The patient was brought into the operating room and lain in the supine position on the OR table. After adequate IV sedation was achieved, an interscalene block was placed preoperatively by the anesthesiologist. The patient then underwent general endotracheal intubation and anesthesia before the patient was repositioned in the beach chair position using the beach chair positioner. The left shoulder and upper extremity were prepped with ChloraPrep solution before being draped sterilely. Preoperative antibiotics were administered. A  standard anterior approach to the shoulder was made through an approximately 4-5 inch incision. The incision was carried down through the subcutaneous tissues to expose the deltopectoral fascia. The interval between the deltoid and pectoralis muscles was identified and this plane developed, retracting the cephalic vein laterally with the deltoid muscle. The conjoined tendon was identified. Its lateral margin was dissected and the Kolbel self-retraining retractor inserted. The "three sisters" were identified and cauterized. Bursal tissues were removed to improve visualization. The remaining inferior portion of the subscapularis tendon was released from its attachment to the lesser tuberosity 1 cm proximal to its insertion and several tagging sutures placed. The inferior capsule was released with care after identifying and protecting the axillary nerve. The proximal humeral cut was made at approximately 30 of retroversion using the extra-medullary guide.   Attention was redirected to the glenoid. The labrum was debrided circumferentially before the center of the glenoid was marked with electrocautery. The guidewire was drilled into the glenoid neck using the appropriate guide. After verifying its position, it was overreamed with the mini-baseplate reamer to create a flat surface. The permanent mini-baseplate was impacted into place. It was stabilized with a 25 x 6.5 mm central screw and four peripheral screws. Locking screws were placed superiorly and inferiorly while nonlocking screws were placed anteriorly and posteriorly. The permanent 36 mm glenosphere was then impacted into place and its Morse taper locking mechanism verified using manual distraction.  Attention was directed to the humeral side. The humeral canal was reamed sequentially beginning with the end-cutting reamer then progressing from a  4 mm reamer up to an 8 mm reamer. This provided excellent circumferential chatter. The canal was broached  beginning with a #6 broach and progressing to a #8 broach. This was left in place and a trial reduction performed using the standard trial humeral platform. The arm demonstrated excellent range of motion as the hand could be brought across the chest to the opposite shoulder and brought to the top of the patient's head and to the patient's ear. The shoulder appeared stable throughout this range of motion. The joint was dislocated and the trial components removed. The permanent #8 mini-stem was impacted into place with care taken to maintain the appropriate version. The permanent 44 mm humeral platform with the standard insert was put together on the back table and impacted into place. Again, the [facility] taper locking mechanism was verified using manual distraction. The shoulder was relocated using two finger pressure and again placed through a range of motion with the findings as described above.  The wound was copiously irrigated with bacitracin saline solution using the jet lavage system before a total of 20 cc of Exparel diluted out to 60 cc with normal saline and 30 cc of 0.5% Sensorcaine with epinephrine was injected into the pericapsular and peri-incisional tissues to help with postoperative analgesia. The subscapularis tendon was reapproximated using #2 FiberWire interrupted sutures. The deltopectoral interval was closed using #0 Vicryl interrupted sutures before the subcutaneous tissues were closed using 2-0 Vicryl interrupted sutures. The skin was closed using staples. Prior to closing the skin, 1 g of transexemic acid in 10 cc of normal saline was injected intra-articularly to help with postoperative bleeding. A sterile occlusive dressing was applied to the wound before the arm was placed into a shoulder immobilizer with an abduction pillow. A Polar Care system also was applied to the shoulder. The patient was then transferred back to a hospital bed before being awakened, extubated, and returned to the  recovery room in satisfactory condition after tolerating the procedure well.

## 2017-02-17 NOTE — Anesthesia Procedure Notes (Signed)
Anesthesia Regional Block: Interscalene brachial plexus block   Pre-Anesthetic Checklist: ,, timeout performed, Correct Patient, Correct Site, Correct Laterality, Correct Procedure, Correct Position, site marked, Risks and benefits discussed,  Surgical consent,  Pre-op evaluation,  At surgeon's request and post-op pain management  Laterality: Left and Upper  Prep: chloraprep       Needles:  Injection technique: Single-shot  Needle Type: Stimiplex     Needle Length: 5cm  Needle Gauge: 22     Additional Needles:   Procedures: ultrasound guided,,,,,,,,  Narrative:  Start time: 02/17/2017 1:07 PM End time: 02/17/2017 1:13 PM Injection made incrementally with aspirations every 5 mL.  Performed by: Personally  Anesthesiologist: Lenard SimmerKARENZ, Sydna Brodowski  Additional Notes: Functioning IV was confirmed and monitors were applied.  A 50mm 22ga Stimuplex needle was used. Sterile prep and drape,hand hygiene and sterile gloves were used.  Negative aspiration and negative test dose prior to incremental administration of local anesthetic. The patient tolerated the procedure well.

## 2017-02-17 NOTE — OR Nursing (Signed)
Patient transferred to PACU for block.  Report given to Tommi EmeryE Sharpe RN

## 2017-02-17 NOTE — Anesthesia Post-op Follow-up Note (Cosign Needed)
Anesthesia QCDR form completed.        

## 2017-02-17 NOTE — Transfer of Care (Signed)
Immediate Anesthesia Transfer of Care Note  Patient: Mark CavalierRandy M Belvedere Sr.  Procedure(s) Performed: Procedure(s): REVERSE SHOULDER ARTHROPLASTY (Left)  Patient Location: PACU  Anesthesia Type:General  Level of Consciousness: sedated  Airway & Oxygen Therapy: Patient Spontanous Breathing and Patient connected to face mask oxygen  Post-op Assessment: Report given to RN and Post -op Vital signs reviewed and stable  Post vital signs: Reviewed and stable  Last Vitals:  Vitals:   02/17/17 1315 02/17/17 1544  BP: (!) 150/100 (!) 147/83  Pulse: 74 80  Resp: 18 13  Temp:  36.3 C    Last Pain:  Vitals:   02/17/17 1544  TempSrc: Tympanic         Complications: No apparent anesthesia complications

## 2017-02-17 NOTE — Anesthesia Postprocedure Evaluation (Signed)
Anesthesia Post Note  Patient: Stasia CavalierRandy M Vitullo Sr.  Procedure(s) Performed: Procedure(s) (LRB): REVERSE SHOULDER ARTHROPLASTY (Left)  Patient location during evaluation: PACU Anesthesia Type: Regional Level of consciousness: awake and alert Pain management: pain level controlled Vital Signs Assessment: post-procedure vital signs reviewed and stable Respiratory status: spontaneous breathing, nonlabored ventilation, respiratory function stable and patient connected to nasal cannula oxygen Cardiovascular status: blood pressure returned to baseline and stable Postop Assessment: no signs of nausea or vomiting Anesthetic complications: no     Last Vitals:  Vitals:   02/17/17 1829 02/17/17 1848  BP: (!) 144/98 (!) 149/84  Pulse:  81  Resp:  16  Temp:      Last Pain:  Vitals:   02/17/17 1837  TempSrc:   PainSc: 0-No pain                 Fareeha Evon S

## 2017-02-18 ENCOUNTER — Encounter: Payer: Self-pay | Admitting: Surgery

## 2017-02-18 MED ORDER — OXYCODONE HCL 5 MG PO TABS: 5.0000 mg | ORAL_TABLET | ORAL | 0 refills | Status: DC | PRN

## 2017-02-18 NOTE — Discharge Instructions (Signed)
Diet: As you were doing prior to hospitalization   Shower:  May shower but keep the wounds dry, use an occlusive plastic wrap, NO SOAKING IN TUB.  If the bandage gets wet, change with a clean dry gauze.  Dressing:  You may change your dressing as needed. Change the dressing with sterile gauze dressing.    Activity:  Increase activity slowly as tolerated, but follow the weight bearing instructions below.  No lifting or driving for 6 weeks.  Weight Bearing:   Non-weightbearing to the left arm.  To prevent constipation: you may use a stool softener such as -  Colace (over the counter) 100 mg by mouth twice a day  Drink plenty of fluids (prune juice may be helpful) and high fiber foods Miralax (over the counter) for constipation as needed.    Itching:  If you experience itching with your medications, try taking only a single pain pill, or even half a pain pill at a time.  You may take up to 10 pain pills per day, and you can also use benadryl over the counter for itching or also to help with sleep.   Precautions:  If you experience chest pain or shortness of breath - call 911 immediately for transfer to the hospital emergency department!!  If you develop a fever greater that 101 F, purulent drainage from wound, increased redness or drainage from wound, or calf pain-[facility].  DVT Prophylaxis: ASA 81mg  twice daily for DVT prophylaxis.                                               Follow- Up Appointment:  Please call for an appointment to be [facility] weeks [facility]

## 2017-02-18 NOTE — Discharge Summary (Signed)
Physician Discharge Summary  Patient ID: Mark BRENNING Sr. MRN: [id number] DOB/AGE: Aug 01, 1951 66 y.o.  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  Discharge Diagnoses: Patient Active Problem List   Diagnosis Date Noted  . 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  Past Medical History:  Diagnosis Date  . GERD (gastroesophageal reflux disease)   . HLD (hyperlipidemia)   . Hypertension   . Pre-diabetes    Transfusion: None   Consultants (if any):   Discharged Condition: Improved  Hospital Course: Mark Rufener. is an 66 y.o. male who was admitted 02/17/2017 with a diagnosis of a severe irreparable rotator cuff tear with cuff arthropathy of the left shoulder and went to the operating room on 02/17/2017 and underwent the above named procedures.    Surgeries: Procedure(s): REVERSE SHOULDER ARTHROPLASTY on 02/17/2017 Patient tolerated the surgery well. Taken to PACU where she was stabilized and then transferred to the orthopedic floor.  Started on Lovenox 40mg  q 24 hrs. Foot pumps applied bilaterally at 80 mm. Heels elevated on bed with rolled towels. No evidence of DVT. Negative Homan. Physical therapy started on day #1 for gait training and transfer. OT started day #1 for ADL and assisted devices.  Patient's IV was d/c on POD1.  Implants: All press-fit [employer] Comprehensive system with a #8 mini-humeral stem, a 44 mm humeral tray with a standard insert, and a mini-base plate with a 36 mm glenosphere.   He was given perioperative antibiotics:  Anti-infectives    Start     Dose/Rate Route Frequency Ordered Stop   02/17/17 2000  ceFAZolin (ANCEF) IVPB 2 g/50 mL premix     2 g 100 mL/hr over 30 Minutes Intravenous  Every 6 hours 02/17/17 1702 02/18/17 0541   02/17/17 1700  ceFAZolin (ANCEF) IVPB 2g/100 mL premix  Status:  Discontinued     2 g 200 mL/hr over 30 Minutes Intravenous Every 6 hours 02/17/17 1651 02/17/17 1701   02/17/17 0922  ceFAZolin (ANCEF) 2-4 GM/100ML-% IVPB    Comments:  KENNEDY, ASHLEY: cabinet override      02/17/17 0922 02/17/17 1350   02/16/17 2300  ceFAZolin (ANCEF) IVPB 2g/100 mL premix     2 g 200 mL/hr over 30 Minutes Intravenous  Once 02/16/17 2253 02/17/17 1350    .  He was given sequential compression devices, early ambulation, and lovenox for DVT prophylaxis.  He benefited maximally from the hospital stay and there were no complications.    Recent vital signs:  Vitals:   02/18/17 0514 02/18/17 0753  BP: 110/65 118/69  Pulse: 68 (!) 58  Resp:  16  Temp: 97.6 F (36.4 C) 97.5 F (36.4 C)    Recent laboratory studies:  Lab Results  Component Value Date   HGB 11.5 (L) 04/13/2014   HGB 11.9 (L) 04/12/2014   Lab Results  Component Value Date   PLT 204 04/12/2014   Lab Results  Component Value Date   INR 1.03 02/16/2017   Lab Results  Component Value Date   NA 139 02/16/2017   K 3.9 02/16/2017   CL 106 02/16/2017   CO2 27 02/16/2017   BUN 15 02/16/2017   CREATININE 0.88 02/16/2017   GLUCOSE 92 02/16/2017    Discharge Medications:   Allergies as of 02/18/2017   No Known Allergies  Medication List    TAKE these medications   aspirin EC 81 MG tablet Take 81 mg by mouth daily.   lisinopril-hydrochlorothiazide 20-25 MG tablet Commonly known as:  PRINZIDE,ZESTORETIC Take 1 tablet by mouth daily.   lovastatin 20 MG tablet Commonly known as:  MEVACOR Take 20 mg by mouth at bedtime.   oxyCODONE 5 MG immediate release tablet Commonly known as:  Oxy IR/ROXICODONE Take 1-2 tablets (5-10 mg total) by mouth every 4 (four) hours as needed for breakthrough pain.   pantoprazole 40 MG tablet Commonly known as:  PROTONIX Take 40 mg by mouth  daily.       Diagnostic Studies: Mr Shoulder Left Wo Contrast  Result Date: 01/26/2017 CLINICAL DATA:  Status post fall 2 weeks ago with onset of left shoulder pain radiating to the elbow. EXAM: MRI OF THE LEFT SHOULDER WITHOUT CONTRAST TECHNIQUE: Multiplanar, multisequence MR imaging of the shoulder was performed. No intravenous contrast was administered. COMPARISON:  MRI left shoulder 10/05/2006. FINDINGS: Rotator cuff: There are complete supraspinatus and infraspinatus tendon tears. Infraspinatus tear is new since the prior exam and the supraspinatus tear has worsened. Retraction is to the glenoid, 4-5 cm. There is also extensive tearing of the subscapularis. The tear appears near complete. Muscles: Marked subscapularis and supraspinatus atrophy is identified. There is some edema within the infraspinatus muscle which may be due to denervation of the patient's recent injury. Biceps long head:  Intact. Acromioclavicular Joint: The patient appears to be status post debridement of the [facility] joint. Type 1 acromion. Subacromial spurring is noted. Fluid is seen in the subacromial/subdeltoid bursa. Glenohumeral Joint: The humeral head is high-riding due to the patient's rotator cuff tears. Labrum:  Intact. Bones: Remote healed fracture of the diaphysis of the humerus is identified. No acute bony abnormality. Other: None. IMPRESSION: Complete supraspinatus and infraspinatus tendon tears and near complete subscapularis tendon tear. Retraction is to the level of the glenoid, 4-5 cm. Marked subscapularis and supraspinatus atrophy is consistent with chronic tears. Edema in the infraspinatus may be due to the patient's recent injury or denervation atrophy. Status post debridement of the [facility] joint without evidence of complication. Subacromial spurring noted. Subacromial/subdeltoid fluid consistent with bursitis. Remote healed diaphyseal fracture proximal humerus. Electronically Signed   By: Drusilla Kannerhomas  Dalessio M.D.   On:  01/26/2017 10:36   Dg Shoulder Left Port  Result Date: 02/17/2017 CLINICAL DATA:  Left shoulder replacement. EXAM: LEFT SHOULDER - 1 VIEW COMPARISON:  MRI 01/26/2017. FINDINGS: Total left shoulder replacement. Hardware intact. No acute bony abnormality. IMPRESSION: Total left shoulder replacement. Electronically Signed   By: Maisie Fushomas  Register   On: 02/17/2017 16:08   Disposition: Plan will be for discharge home today following sessions with PT this morning.  Follow-up Information    Meriel PicaJames L Jinelle Butchko, PA-C Follow up in 12 day(s).   Specialty:  Physician Assistant Why:  Mindi SlickerStaple Removal. Contact information: [address] [facility] [place] [id number] [phone]          Signed: Meriel PicaJames L Anjelika Ausburn PA-C 02/18/2017, 8:01 AM

## 2017-02-18 NOTE — Progress Notes (Signed)
Shift assessment completed. Pt is alert and oriented, lungs aer clear bilat, pt is on room air. HR is regular, abdomen is soft, bs heard. Pt is oob to bathroom independently with steady gait, denied difficulty voiding,ppp, no edema noted. PIV #20 intact to R fa, site is free of redness and swelling, ivf is dc'd at this time. Pt denied pain, stated he has some numbness to his l ear and down his neck. Shoulder immobilizer is on L side, polar care intact, honeycomb dressing under this is also dry and intact. Pt is able to move fingers to l hand freely and fingers are warm. Dr. Joice LoftsPoggi and PA Horris LatinoLance McGhee in to see pt at time of assessment as well. Pt in no distress, call bell in reach.

## 2017-02-18 NOTE — Plan of Care (Signed)
Problem: Urinary Elimination: Goal: Ability to achieve and maintain adequate urine output will improve Outcome: Completed/Met Date Met: 02/18/17 Pt has met all goals for discharge.

## 2017-02-18 NOTE — Progress Notes (Signed)
Clinical Social Worker (CSW) received SNF consult. PT is recommending that patient can go home. RN case manager aware of above. Please reconsult if future social work needs arise. CSW signing off.   Jadore Veals, LCSW [phone] 

## 2017-02-18 NOTE — Evaluation (Signed)
Occupational Therapy Evaluation Patient Details Name: Stasia CavalierRandy M Cullinan Sr. MRN: [id number] DOB: [birth year] Today's Date: 02/18/2017    History of Present Illness Pt. is a 66 y.o. male who [facility] for a Left Total Shoulder Replacement. Pt. PMHx: GERD, Hyperlipidemia, HTN, Pre Diabetes.    Clinical Impression   Pt. Is a 66 y.o. Male who [facility] Should[facility] Re[facility]. Pt. Presents with Left UE immobilization, weakness, decreased ROM, and inpaired LEFT UE functional use which limits his ability to complete ADLs, and IADLs. Pt. Requires increased assist for UE and LE dressing. Pt. and family education was provided about compensatory techniques and UE dressing. Pt. Could benefit from skilled OT services for ADL training, A/E training, UE there. Ex, and pt. Family education about home modification, and DME.     Follow Up Recommendations  No OT follow up    Equipment Recommendations       Recommendations for Other Services       Precautions / Restrictions Precautions Precautions: Shoulder Type of Shoulder Precautions: No AROM of L shoulder, in sling at all times Shoulder Interventions: Shoulder sling/immobilizer Required Braces or Orthoses: Other Brace/Splint Other Brace/Splint: L shoulder sling Restrictions Weight Bearing Restrictions: Yes LUE Weight Bearing: Non weight bearing              ADL Overall ADL's : Needs assistance/impaired Eating/Feeding: Set up;Minimal assistance;Independent (assist cutting meat. )   Grooming: Set up;Independent           Upper Body Dressing : Maximal assistance                   Functional mobility during ADLs: Independent General ADL Comments: Pt. education was provided about A/E use for LE ADLs, UE dressing techniques, home set-up, and DME.     Vision         Perception     Praxis      Pertinent Vitals/Pain Pain Assessment: No/denies pain     Hand Dominance Right    Extremity/Trunk Assessment Upper Extremity Assessment Upper Extremity Assessment: Overall WFL for tasks assessed LUE Deficits / Details: Full L grip strength. Reports 95% intact sensation to L hand. Reports continued numbness L ear, L side of neck, and L upper arm from nerve block. RUE grossly [facility]   Lower Extremity Assessment Lower Extremity Assessment: Overall WFL for tasks assessed       Communication Communication Communication: No difficulties   Cognition Arousal/Alertness: Awake/alert Behavior During Therapy: WFL for tasks assessed/performed Overall Cognitive Status: Within Functional Limits for tasks assessed                     General Comments       Exercises     Home Living Family/patient expects to be discharged to:: Private residence Living Arrangements: Spouse/significant other Available Help at Discharge: Family Type of Home: House Home Access: Ramped entrance     Home Layout: Multi-level;Laundry or work area in basement;Bed/bath upstairs Alternate Teacher, musicLevel Stairs-Number of Steps: 12 Alternate Level Stairs-Rails: Right Bathroom Shower/Tub: Tub/shower unit Shower/tub characteristics: Engineer, building servicesCurtain Bathroom Toilet: Standard     Home Equipment: Cane - single point;Walker - 2 wheels          Prior Functioning/Environment Level of Independence: Independent        Comments: Indpendent with ADLs/IADLs, Pt. works in Consulting civil engineerT, drives.        OT Problem List: Decreased strength;Impaired UE functional use;Decreased range of motion;Decreased knowledge of  use of DME or AE      OT Treatment/Interventions: Self-care/ADL training    OT Goals(Current goals can be found in the care plan section) Acute Rehab OT Goals Patient Stated Goal: To return home OT Goal Formulation: With patient/family Potential to Achieve Goals: Good  OT Frequency: Min 1X/week   Barriers to D/C:            Co-evaluation              End of Session    Activity Tolerance:  Patient tolerated treatment well Patient left: in bed;with call bell/phone within reach;with family/visitor present                   ADL either performed or assessed with clinical judgement  Time: 1610-9604 OT Time Calculation (min): 23 min Charges:  OT General Charges $OT Visit: 1 Procedure OT Evaluation $OT Eval Moderate Complexity: 1 Procedure G-Codes:    Olegario Messier, MS, OTR/L  Olegario Messier, MS, OTR/L 02/18/2017, 11:22 AM

## 2017-02-18 NOTE — Progress Notes (Signed)
Pt has participated with physical therapy and is ambulating freely with l shoulder immobilizer in place. PIV removed from r arm by this writer with catheter intact, pt tolerated well. Pt continued to remain pain free. Pt received dc/ instructions, these were reviewed with pt and his wife, who verbalized understanding and signed for receipt of copy. Pt is awaiting wheelchair transport to entrance of facility.

## 2017-02-18 NOTE — Care Management Note (Signed)
Case Management Note  Patient Details  Name: GRAESON NOURI Sr. MRN: [id number] Date of Birth: 1951/09/27  Subjective/Objective:  Met with patient and his wife at bedside to discuss discharge planning. Offered choice of home health agencies for PT/OT. They have no agency preference. Referral to [facility] for HHPT/OT. Requested weekend visit. PCP is John walker. No DME needs.                   Action/Plan: [facility] for HHPT/OT.   Expected Discharge Date:  02/18/17               Expected Discharge Plan:  [place]  In-House Referral:     Discharge planning Services  CM Consult  Post Acute Care Choice:  Home Health Choice offered to:  Patient  DME Arranged:    DME Agency:     HH Arranged:  PT, OT HH Agency:  [facility]  Status of Service:  Completed, signed off  If discussed at [place] of Stay Meetings, dates discussed:    Additional Comments:  Jolly Mango, RN 02/18/2017, 9:47 AM

## 2017-02-18 NOTE — Progress Notes (Signed)
  Subjective: 1 Day Post-Op Procedure(s) (LRB): REVERSE SHOULDER ARTHROPLASTY (Left) Patient reports pain as mild, nerve block prior to surgery still intact..   Patient is well, and has had no acute complaints or problems Plan is to go Home after hospital stay. Negative for chest pain and shortness of breath Fever: no Gastrointestinal:Negative for nausea and vomiting  Objective: Vital signs in last 24 hours: Temp:  [96.8 F (36 C)-98.8 F (37.1 C)] 97.5 F (36.4 C) (02/23 0753) Pulse Rate:  [57-87] 58 (02/23 0753) Resp:  [10-21] 16 (02/23 0753) BP: (101-155)/(63-102) 118/69 (02/23 0753) SpO2:  [95 %-100 %] 98 % (02/23 0753) FiO2 (%):  [21 %] 21 % (02/22 1652) Weight:  [88 kg (194 lb)] 88 kg (194 lb) (02/22 1032)  Intake/Output from previous day:  Intake/Output Summary (Last 24 hours) at 02/18/17 0755 Last data filed at 02/18/17 0441  Gross per 24 hour  Intake          2808.33 ml  Output              150 ml  Net          2658.33 ml    Intake/Output this shift: No intake/output data recorded.  Labs: No results for input(s): HGB in the last 72 hours. No results for input(s): WBC, RBC, HCT, PLT in the last 72 hours.  Recent Labs  02/16/17 1348  NA 139  K 3.9  CL 106  CO2 27  BUN 15  CREATININE 0.88  GLUCOSE 92  CALCIUM 9.1    Recent Labs  02/16/17 1348  INR 1.03    EXAM General - Patient is Alert, Appropriate and Oriented Extremity - ABD soft Intact pulses distally Dorsiflexion/Plantar flexion intact Incision: dressing C/D/I No cellulitis present Dressing/Incision - clean, dry, no drainage Motor Function - intact, moving foot and toes well on exam.  Pt has decreased sensation to light touch over the lateral aspect of the neck and decreased over the left deltoid, slightly better sensation over the left wrist and forearm.  Able to flex and extend wrist on command.  Past Medical History:  Diagnosis Date  . GERD (gastroesophageal reflux disease)   .  HLD (hyperlipidemia)   . Hypertension   . Pre-diabetes     Assessment/Plan: 1 Day Post-Op Procedure(s) (LRB): REVERSE SHOULDER ARTHROPLASTY (Left) Active Problems:   Status post reverse total shoulder replacement, left  Estimated body mass index is 30.38 kg/m as calculated from the following:   Height as of 02/16/17: 5\' 7"  (1.702 m).   Weight as of this encounter: 88 kg (194 lb). Advance diet Up with therapy D/C IV fluids when tolerating po intake.  Pt still having effects from nerve block from prior to surgery.  Pt was the last operation yesterday. Plan will be up with therapy this AM, plan will be for discharge home this afternoon.  DVT Prophylaxis - Lovenox, Foot Pumps and TED hose Non-weightbearing to the left upper extremity.  Valeria BatmanJ. Lance McGhee, PA-C [facility] 02/18/2017, 7:55 AM

## 2017-02-18 NOTE — Evaluation (Signed)
Physical Therapy Evaluation Patient Details Name: Mark CavalierRandy M Hannula Sr. MRN: [id number] DOB: 1951-02-14 Today's Date: 02/18/2017   History of Present Illness  Pt. is a 66 y.o. male who [facility] Total Shoulder Replacement. Pt. PMHx: GERD, Hyperlipidemia, HTN, Pre Diabetes.   Clinical Impression  Pt admitted with above diagnosis. Pt currently with functional limitations due to the deficits listed below (see PT Problem List).  Pt is independent with all bed mobility, transfers, ambulation, and stairs. No balance deficits identified and pt can perform single leg balance for greater than 10 seconds. Upon arrival to the room pt was standing up making his own bed. Pt and wife educated about L wrist, hand, and elbow exercises that are appropriate. Educated about donning/doffing sling and polar care. Pt is appropriate to return home with [facility] and [facility] when medically appropriate. Pt will benefit from skilled PT services to address deficits in strength, balance, and mobility in order to return to full function at home.     Follow Up Recommendations Home health PT    Equipment Recommendations  None recommended by PT    Recommendations for Other Services OT consult     Precautions / Restrictions Precautions Precautions: Shoulder Type of Shoulder Precautions: No AROM of L shoulder, in sling at all times Shoulder Interventions: Shoulder sling/immobilizer Required Braces or Orthoses: Other Brace/Splint Other Brace/Splint: L shoulder sling Restrictions Weight Bearing Restrictions: Yes LUE Weight Bearing: Non weight bearing      Mobility  Bed Mobility               General bed mobility comments: Pt received standing up making his own bed. Left upright at EOB. Bed mobiltiy not assessed at this time  Transfers Overall transfer level: Independent Equipment used: None             General transfer comment: Pt demonstrates normal strength and stability during  transfers. Good speed.  Ambulation/Gait Ambulation/Gait assistance: Independent Ambulation Distance (Feet): 200 Feet Assistive device: None Gait Pattern/deviations: WFL(Within Functional Limits) Gait velocity: WFL for full community mobility Gait velocity interpretation: >2.62 ft/sec, indicative of independent community ambulator General Gait Details: Pt able to ambulate a full lap around RN station without assistive device. No deficits noted. Gait speed WFL. Pt able to perform horizontal and vertical head turns, gait speed changes, and body turns without LOB and safety concerns. No fatigue reqported  Stairs Stairs: Yes Stairs assistance: Independent Stair Management: One rail Right Number of Stairs: 4 General stair comments: Pt able to safely and confidently ascend/descend 4 stairs with unilateral railing and alternating stepping pattern  Wheelchair Mobility    Modified Rankin (Stroke Patients Only)       Balance Overall balance assessment: Independent             Standing balance comment: Single leg balance >10 seconds. Able to perform head turns and gait speed changes without ambulation without instability                             Pertinent Vitals/Pain Pain Assessment: No/denies pain    Home Living Family/patient expects to be discharged to:: Private residence Living Arrangements: Spouse/significant other Available Help at Discharge: Family Type of Home: House Home Access: Ramped entrance     Home Layout: Multi-level;Laundry or work area in Risk analystbasement;Bed/bath upstairs Home Equipment: Gilmer MorCane - single point;Walker - 2 wheels      Prior Function Level of Independence: Independent  Comments: Indpendent with ADLs/IADLs, Pt. works in Consulting civil engineer, drives.     Hand Dominance   Dominant Hand: Right    Extremity/Trunk Assessment   Upper Extremity Assessment Upper Extremity Assessment: Overall WFL for tasks assessed LUE Deficits / Details: Full L  grip strength. Reports 95% intact sensation to L hand. Reports continued numbness L ear, L side of neck, and L upper arm from nerve block. RUE grossly [facility]    Lower Extremity Assessment Lower Extremity Assessment: Overall WFL for tasks assessed       Communication   Communication: No difficulties  Cognition Arousal/Alertness: Awake/alert Behavior During Therapy: WFL for tasks assessed/performed Overall Cognitive Status: Within Functional Limits for tasks assessed                      General Comments      Exercises Other Exercises Other Exercises: Pt educated about L shoulder precautions. Instructed in L elbow AAROM with RUE as well as R grip strengthening and R wrist flexion/extension. Pt taken through 10 degrees of passive L shoulder flexion as well as ER but discontinued due to inability to feel L shoulder from nerve block. Educated about use of polar care as well as donning/doffing Donning/doffing sling/immobilizer: Minimal assistance Correct positioning of sling/immobilizer: Minimal assistance ROM for elbow, wrist and digits of operated UE: Modified independent Sling wearing schedule (on at all times/off for ADL's): Modified independent   Assessment/Plan    PT Assessment Patient needs continued PT services  PT Problem List Decreased strength;Decreased range of motion;Decreased activity tolerance;Decreased knowledge of precautions;Pain;Impaired sensation       PT Treatment Interventions Gait training;Stair training;Functional mobility training;DME instruction;Therapeutic activities;Therapeutic exercise;Patient/family education;Manual techniques    PT Goals (Current goals can be found in the Care Plan section)  Acute Rehab PT Goals Patient Stated Goal: Return to prior level of function at home and work PT Goal Formulation: With patient/family Time For Goal Achievement: 03/04/17 Potential to Achieve Goals: Good    Frequency BID   Barriers to discharge         Co-evaluation               End of Session Equipment Utilized During Treatment: Gait belt Activity Tolerance: Patient tolerated treatment well Patient left: in bed;with call bell/phone within reach;with family/visitor present;Other (comment) (Listed as low fall risk in computer so no alarm) Nurse Communication: Other (comment) (Care manager and CSW notified of performance during eval) PT Visit Diagnosis: Muscle weakness (generalized) (M62.81);Pain Pain - Right/Left: Left Pain - part of body: Shoulder         Time: 4098-1191 PT Time Calculation (min) (ACUTE ONLY): 25 min   Charges:   PT Evaluation $PT Eval Low Complexity: 1 Procedure PT Treatments $Therapeutic Exercise: 8-22 mins   PT G Codes:        Sharalyn Ink Patrycja Mumpower PT, DPT   Tamel Abel 02/18/2017, 10:29 AM

## 2017-02-21 LAB — SURGICAL PATHOLOGY

## 2017-12-08 ENCOUNTER — Other Ambulatory Visit: Payer: Self-pay | Admitting: Surgery

## 2017-12-08 DIAGNOSIS — M25511 Pain in right shoulder: Secondary | ICD-10-CM

## 2017-12-08 DIAGNOSIS — M75101 Unspecified rotator cuff tear or rupture of right shoulder, not specified as traumatic: Secondary | ICD-10-CM

## 2017-12-15 ENCOUNTER — Ambulatory Visit
Admission: RE | Admit: 2017-12-15 | Discharge: 2017-12-15 | Disposition: A | Discharge status: Home / Self Care | Payer: 59 | Source: Ambulatory Visit | Attending: Surgery | Admitting: Surgery

## 2017-12-15 DIAGNOSIS — M75101 Unspecified rotator cuff tear or rupture of right shoulder, not specified as traumatic: Secondary | ICD-10-CM | POA: Diagnosis present

## 2017-12-15 DIAGNOSIS — M12811 Other specific arthropathies, not elsewhere classified, right shoulder: Secondary | ICD-10-CM | POA: Diagnosis not present

## 2017-12-15 DIAGNOSIS — M25511 Pain in right shoulder: Secondary | ICD-10-CM

## 2019-04-08 ENCOUNTER — Emergency Department
Admission: EM | Admit: 2019-04-08 | Discharge: 2019-04-08 | Disposition: A | Discharge status: Home / Self Care | Payer: Managed Care, Other (non HMO) | Attending: Emergency Medicine | Admitting: Emergency Medicine

## 2019-04-08 ENCOUNTER — Emergency Department: Payer: Managed Care, Other (non HMO)

## 2019-04-08 ENCOUNTER — Other Ambulatory Visit: Payer: Self-pay

## 2019-04-08 DIAGNOSIS — T18120A Food in esophagus causing compression of trachea, initial encounter: Secondary | ICD-10-CM | POA: Insufficient documentation

## 2019-04-08 DIAGNOSIS — Y999 Unspecified external cause status: Secondary | ICD-10-CM | POA: Diagnosis not present

## 2019-04-08 DIAGNOSIS — Z7982 Long term (current) use of aspirin: Secondary | ICD-10-CM | POA: Diagnosis not present

## 2019-04-08 DIAGNOSIS — Z87891 Personal history of nicotine dependence: Secondary | ICD-10-CM | POA: Diagnosis not present

## 2019-04-08 DIAGNOSIS — Y9289 Other specified places as the place of occurrence of the external cause: Secondary | ICD-10-CM | POA: Insufficient documentation

## 2019-04-08 DIAGNOSIS — K222 Esophageal obstruction: Secondary | ICD-10-CM

## 2019-04-08 DIAGNOSIS — X58XXXA Exposure to other specified factors, initial encounter: Secondary | ICD-10-CM | POA: Insufficient documentation

## 2019-04-08 DIAGNOSIS — I10 Essential (primary) hypertension: Secondary | ICD-10-CM | POA: Insufficient documentation

## 2019-04-08 DIAGNOSIS — Y9389 Activity, other specified: Secondary | ICD-10-CM | POA: Insufficient documentation

## 2019-04-08 DIAGNOSIS — Z79899 Other long term (current) drug therapy: Secondary | ICD-10-CM | POA: Insufficient documentation

## 2019-04-08 DIAGNOSIS — R0989 Other specified symptoms and signs involving the circulatory and respiratory systems: Secondary | ICD-10-CM | POA: Diagnosis present

## 2019-04-08 DIAGNOSIS — T18128A Food in esophagus causing other injury, initial encounter: Secondary | ICD-10-CM

## 2019-04-08 LAB — BASIC METABOLIC PANEL
Anion gap: 10 (ref 5–15)
BUN: 17 mg/dL (ref 8–23)
CO2: 27 mmol/L (ref 22–32)
Calcium: 10 mg/dL (ref 8.9–10.3)
Chloride: 102 mmol/L (ref 98–111)
Creatinine, Ser: 0.94 mg/dL (ref 0.61–1.24)
GFR calc Af Amer: >60 mL/min (ref >60)
GFR calc non Af Amer: >60 mL/min (ref >60)
Glucose, Bld: 172 mg/dL — ABNORMAL HIGH (ref 70–99)
Potassium: 4.1 mmol/L (ref 3.5–5.1)
Sodium: 139 mmol/L (ref 135–145)

## 2019-04-08 LAB — CBC
HCT: 48.4 % (ref 39.0–52.0)
Hemoglobin: 16.2 g/dL (ref 13.0–17.0)
MCH: 28.3 pg (ref 26.0–34.0)
MCHC: 33.5 g/dL (ref 30.0–36.0)
MCV: 84.5 fL (ref 80.0–100.0)
Platelets: 290 10*3/uL (ref 150–400)
RBC: 5.73 MIL/uL (ref 4.22–5.81)
RDW: 13.2 % (ref 11.5–15.5)
WBC: 7.8 10*3/uL (ref 4.0–10.5)
nRBC: 0 % (ref 0.0–0.2)

## 2019-04-08 LAB — TROPONIN I: Troponin I: <0.03 ng/mL (ref <0.03)

## 2019-04-08 MED ORDER — NITROGLYCERIN 0.4 MG SL SUBL
0.4000 mg | SUBLINGUAL_TABLET | SUBLINGUAL | Status: DC
  Filled 2019-04-08: qty 1

## 2019-04-08 NOTE — ED Triage Notes (Signed)
Of note, patient's wife is in CCU and has been for the past 14 days. Her Covid-19 testing came back negative.

## 2019-04-08 NOTE — ED Notes (Signed)
IV to left forearm removed with catheter intact

## 2019-04-08 NOTE — ED Notes (Signed)
Pt verbalized understanding of discharge instructions and return instructions. Topaz not working.

## 2019-04-08 NOTE — ED Notes (Signed)
Pt c/o eating broccoli and lentils and then feeling like a "chunk of food is stuck in my throat"  Pt reports hx of same but "this usually clears by it's self", takes meds for HTN, cholesterol and GERD

## 2019-04-08 NOTE — ED Notes (Signed)
Pt reports pain and obstruction resolved when this RN returned to room, pt able to drink entire soda without pause

## 2019-04-08 NOTE — ED Provider Notes (Signed)
[facility] Emergency Department Provider Note   ____________________________________________   First MD Initiated Contact with Patient 04/08/19 1928     (approximate)  I have reviewed the triage vital signs and the nursing notes.   HISTORY  Chief Complaint Choking (Food bolus)    HPI Mark Fisher. is a 68 y.o. male patient reports he is got something stuck in his throat cannot swallow his saliva  About 1 hour ago he just finished a meal it was consistent of lentils, vegetables.  After the meal he swallowed felt something become stuck just behind his breastbone.  Since then he has been spitting up his saliva and it seems to come back up.  He is never anything get stuck before but reports sometimes things feel like to go down slowly and he is to swallow little extra to get stuff down.  Reports the pain is not really painful but just a stuck feeling right behind his breastbone.  No fevers or chills.  No trouble breathing.  Said he regurgitated a little bit when initially occurred.  Does not take any recent medications for erectile dysfunction.  Reports he is never had this happen before.  Denies "chest pain" discomfort does not radiate.  There is no abdominal pain with it.  He reports he is been attempting to belch several times since this occurred   Past Medical History:  Diagnosis Date  . GERD (gastroesophageal reflux disease)   . HLD (hyperlipidemia)   . Hypertension   . Pre-diabetes     Patient Active Problem List   Diagnosis Date Noted  . 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 FRACTURE SURGERY    . JOINT REPLACEMENT     RTHR  . REVERSE SHOULDER ARTHROPLASTY Left 02/17/2017   Procedure: REVERSE SHOULDER ARTHROPLASTY;  Surgeon: Christena Flake, MD;  Location: [facility] ORS;  Service: Orthopedics;  Laterality: Left;  . SHOULDER SURGERY Left     Prior to Admission  medications   Medication Sig Start Date End Date Taking? Authorizing Provider  aspirin EC 81 MG tablet Take 81 mg by mouth daily.    [provider]  lisinopril-hydrochlorothiazide (PRINZIDE,ZESTORETIC) 20-25 MG tablet Take 1 tablet by mouth daily.    [provider]  lovastatin (MEVACOR) 20 MG tablet Take 20 mg by mouth at bedtime.    [provider]  oxyCODONE (OXY IR/ROXICODONE) 5 MG immediate release tablet Take 1-2 tablets (5-10 mg total) by mouth every 4 (four) hours as needed for breakthrough pain. 02/18/17   Anson [place], PA-C  pantoprazole (PROTONIX) 40 MG tablet Take 40 mg by mouth daily.    [provider]    Allergies Patient has no known allergies.  No family history on file.  Social History Social History   Tobacco Use  . Smoking status: Former Games developer  . Smokeless tobacco: Never Used  Substance Use Topics  . Alcohol use: Yes  . Drug use: No    Review of Systems Constitutional: No fever/chills Eyes: No visual changes. ENT: No sore throat. Cardiovascular: See HPI Respiratory: Denies shortness of breath. Gastrointestinal: No abdominal pain.   Genitourinary: Negative for dysuria. Musculoskeletal: Negative for back pain. Skin: Negative for rash. Neurological: Negative for headaches, areas of focal weakness or numbness.    ____________________________________________   PHYSICAL EXAM:  VITAL SIGNS: ED Triage Vitals  Enc Vitals Group     BP 04/08/19 1914 Marland Kitchen)  160/93     Pulse Rate 04/08/19 1914 74     Resp 04/08/19 1914 20     Temp 04/08/19 1914 99 F (37.2 C)     Temp Source 04/08/19 1914 Oral     SpO2 04/08/19 1914 100 %     Weight 04/08/19 1915 192 lb (87.1 kg)     Height 04/08/19 1915 5\' 7"  (1.702 m)     Head Circumference --      Peak Flow --      Pain Score 04/08/19 1915 0     Pain Loc --      Pain Edu? --      Excl. in GC? --     Constitutional: Alert and oriented. Well appearing and in no acute  distress. Eyes: Conjunctivae are normal. Head: Atraumatic. Nose: No congestion/rhinnorhea. Mouth/Throat: Mucous membranes are moist.  There is spitting up clear saliva into an emesis bag.  Upon 100 mL's.  He has no acute airway compromise.  He speaking clearly. Neck: No stridor.  Cardiovascular: Normal rate, regular rhythm. Grossly normal heart sounds.  Good peripheral circulation. Respiratory: Normal respiratory effort.  No retractions. Lungs CTAB. Gastrointestinal: Soft and nontender. No distention. Musculoskeletal: No lower extremity tenderness nor edema. Neurologic:  Normal speech and language. No gross focal neurologic deficits are appreciated.  Skin:  Skin is warm, dry and intact. No rash noted. Psychiatric: Mood and affect are normal. Speech and behavior are normal.  ____________________________________________   LABS (all labs ordered are listed, but only abnormal results are displayed)  Labs Reviewed  BASIC METABOLIC PANEL - Abnormal; Notable for the following components:      Result Value   Glucose, Bld 172 (*)    All other components within normal limits  CBC  TROPONIN I   ____________________________________________  EKG  ED ECG REPORT I, Sharyn CreamerMark Kelan Pritt, the attending physician, personally viewed and interpreted this ECG.  Date: 04/08/2019 EKG Time: 2005 Rate: 65 Rhythm: normal sinus rhythm QRS Axis: normal Intervals: normal ST/T Wave abnormalities: normal Narrative Interpretation: no evidence of acute ischemia  ____________________________________________  RADIOLOGY  Chest x-ray clear ____________________________________________   PROCEDURES  Procedure(s) performed: None  Procedures  Critical Care performed: No  ____________________________________________   INITIAL IMPRESSION / ASSESSMENT AND PLAN / ED COURSE  Pertinent labs & imaging results that were available during my care of the patient were reviewed by me and considered in my medical  decision making (see chart for details).   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  We will attempt nitrates to relax esophagus, also soda to see if we can loosen this and allowed to pass.  None of his symptomatology seems consistent with acute coronary or acute pulmonary process.  He does not appear to be aspirated.  He is resting pretty well but spitting up clear amounts of saliva.  Will check labs and assure no evidence of ACS, however based on his clinical history I highly suspect this is acute GI likely esophageal bolus.     Patient feels much better, after drinking a soda he reports that the feeling is cleared.  He feels much better, he is able to swallow and drink things without difficulty now.  Appears consistent with a relieved esophageal food bolus.  Discussed with the patient careful return precautions which he is agreeable with and he will follow-up with gastroenterology and his primary care physician.  ____________________________________________   FINAL CLINICAL IMPRESSION(S) / ED DIAGNOSES  Final diagnoses:  Esophageal obstruction  due to food impaction        Note:  This document was prepared using Sales executive software and may include unintentional dictation errors       Sharyn Creamer, MD 04/08/19 2137

## 2019-04-08 NOTE — ED Notes (Signed)
X-ray at bedside

## 2019-04-08 NOTE — ED Notes (Signed)
ED Provider at bedside. 

## 2019-04-08 NOTE — ED Triage Notes (Signed)
Patient presents to ED with a possible food bolus of broccoli and lentils stuck in the esophagus. Is unable to get fluids to pass. Spitting into bag while in triage. Able to talk but has to stop to get a full sentence out.

## 2019-04-08 NOTE — Discharge Instructions (Signed)
Please return to the emergency room right away if you are to develop a fever, chest pain, breathing problems, you feel food is "stuck" again, severe nausea, your pain becomes severe or worsens, you are unable to keep food down, begin vomiting any dark or bloody fluid, you develop any dark or bloody stools, feel dehydrated, or other new concerns or symptoms arise.

## 2019-04-09 ENCOUNTER — Telehealth: Payer: Self-pay | Admitting: Gastroenterology

## 2019-04-09 NOTE — Telephone Encounter (Signed)
Spoke with pt to schedule ED f/u apt with Dr. Maximino Greenland per note, Pt states he just had a scare about certain sticky foods he eats like rice he states he doe not want to schedule apt right now but is aware to call us if he needs [place].

## 2021-07-03 ENCOUNTER — Other Ambulatory Visit: Payer: Self-pay | Admitting: Physician Assistant

## 2021-07-03 ENCOUNTER — Ambulatory Visit: Payer: Medicare Other

## 2021-07-03 DIAGNOSIS — M7989 Other specified soft tissue disorders: Secondary | ICD-10-CM

## 2021-07-07 ENCOUNTER — Other Ambulatory Visit: Payer: Self-pay

## 2021-07-07 ENCOUNTER — Ambulatory Visit
Admission: RE | Admit: 2021-07-07 | Discharge: 2021-07-07 | Disposition: A | Discharge status: Home / Self Care | Payer: Medicare Other | Source: Ambulatory Visit | Attending: Physician Assistant | Admitting: Physician Assistant

## 2021-07-07 DIAGNOSIS — M7989 Other specified soft tissue disorders: Secondary | ICD-10-CM | POA: Diagnosis not present

## 2023-07-11 IMAGING — US US EXTREM LOW VENOUS*R*
1 series · 13 of 24 positions shown · non-contrast
Comparison: None.

CLINICAL DATA: Pain, edema post fall. History of left lower
extremity DVT.

EXAM:
RIGHT LOWER EXTREMITY VENOUS DOPPLER ULTRASOUND
TECHNIQUE: Gray-scale sonography with compression, as well as color and duplex
ultrasound, were performed to evaluate the deep venous system(s)
from the level of the common femoral vein through the popliteal and
proximal calf veins.

[Series 1: us extrem low venous*right* · 0.08mm/px · 13 of 38 slices shown]
[im 1/38]
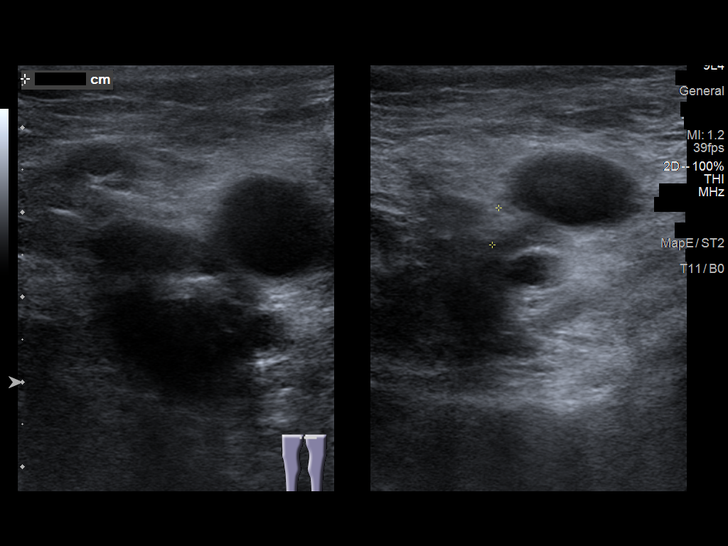
[im 4/38]
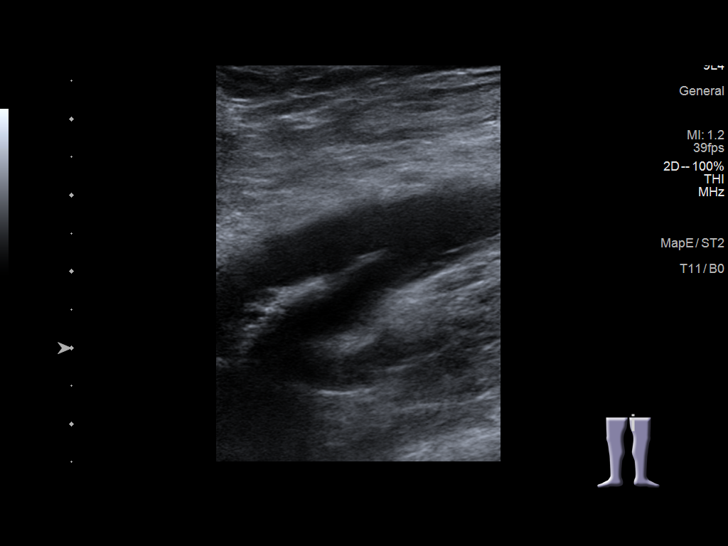
[im 7/38]
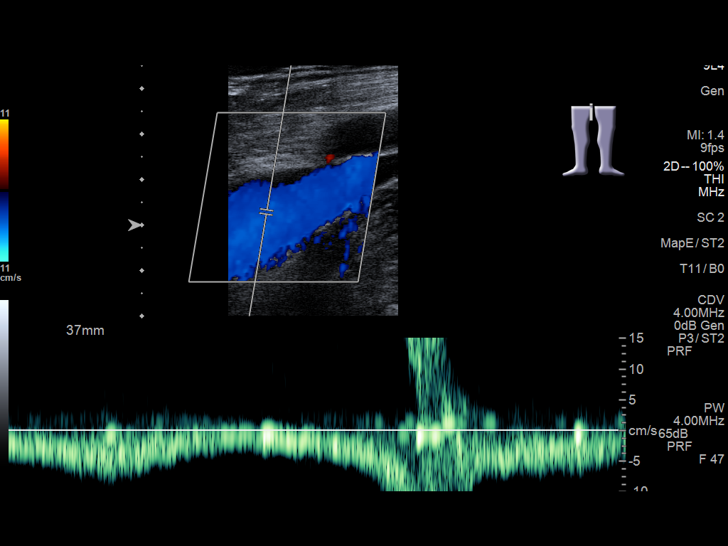
[im 10/38]
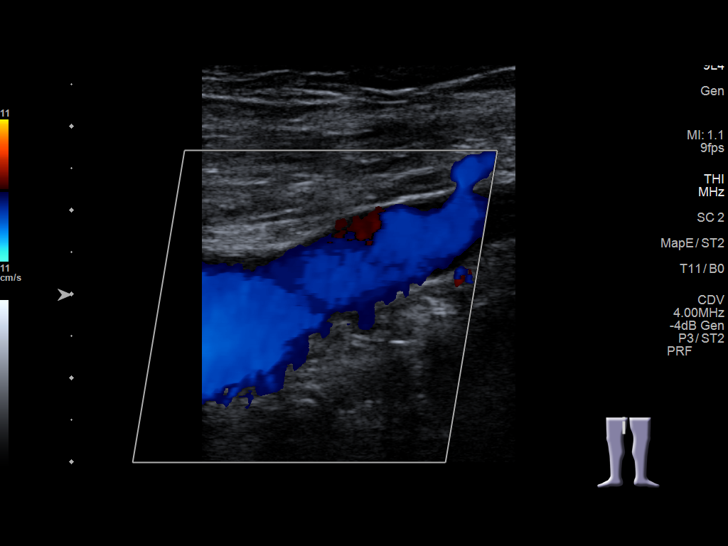
[im 13/38]
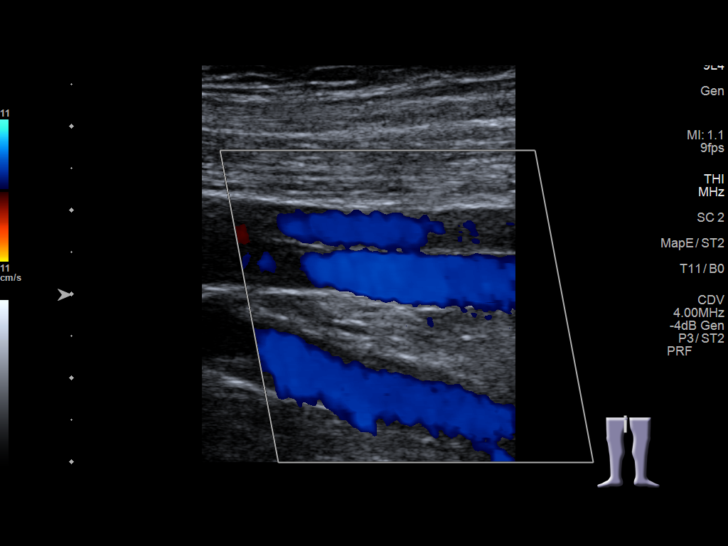
[im 17/38]
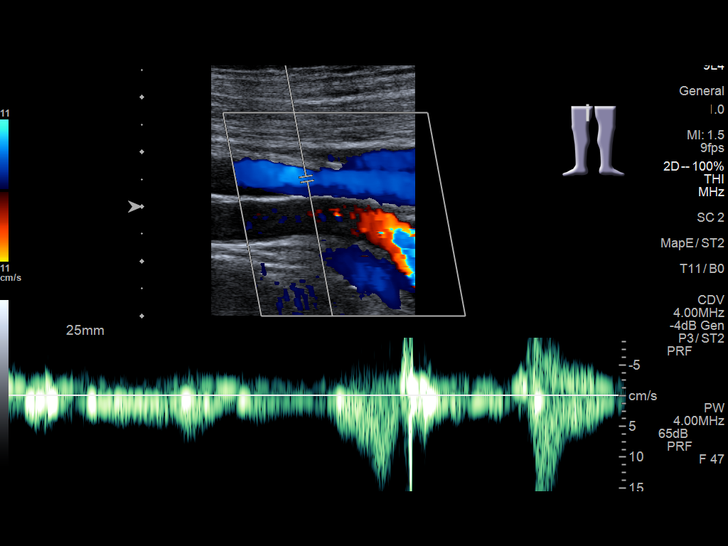
[im 20/38]
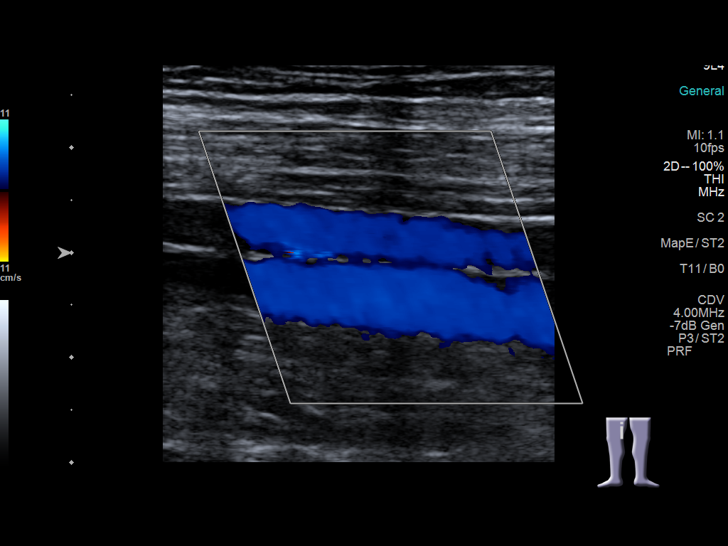
[im 21/38]
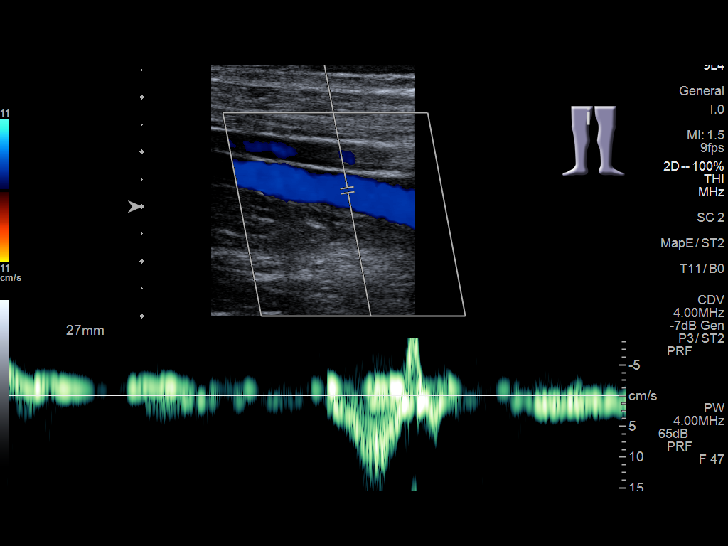
[im 25/38]
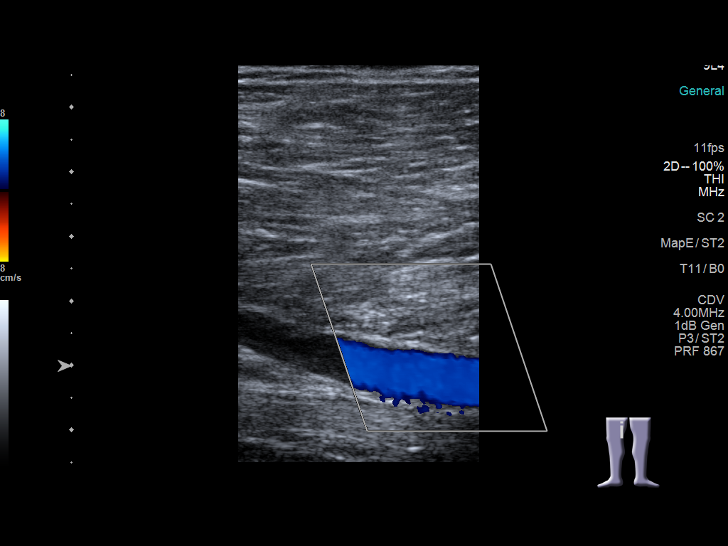
[im 28/38]
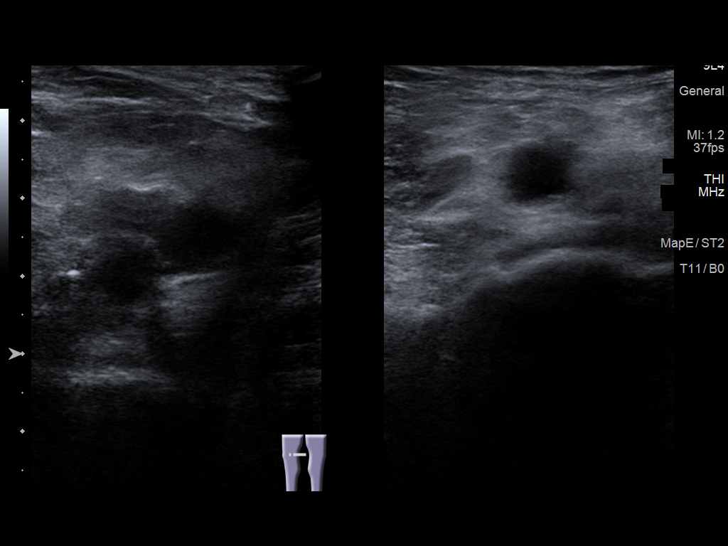
[im 31/38]
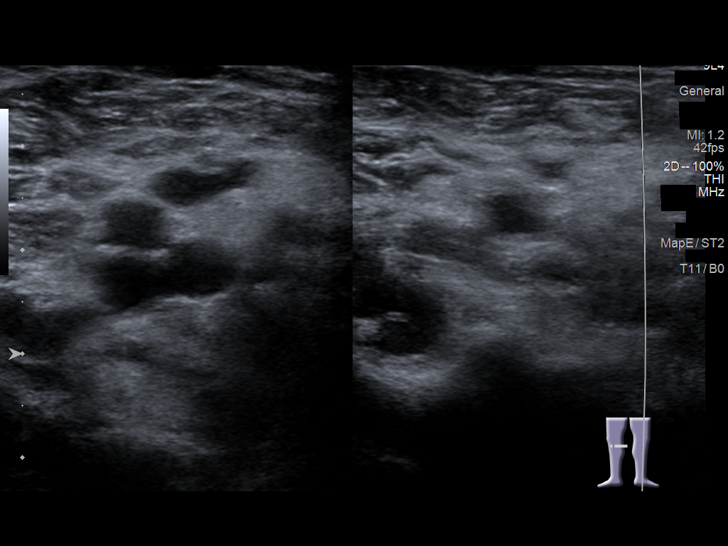
[im 34/38]
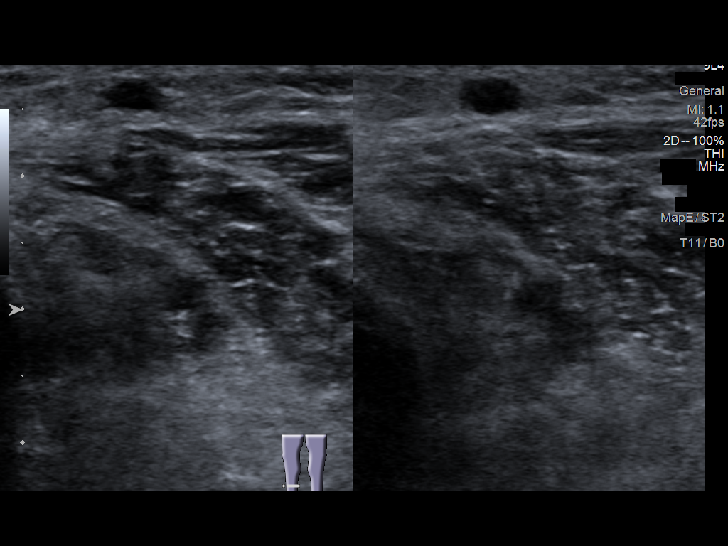
[im 38/38]
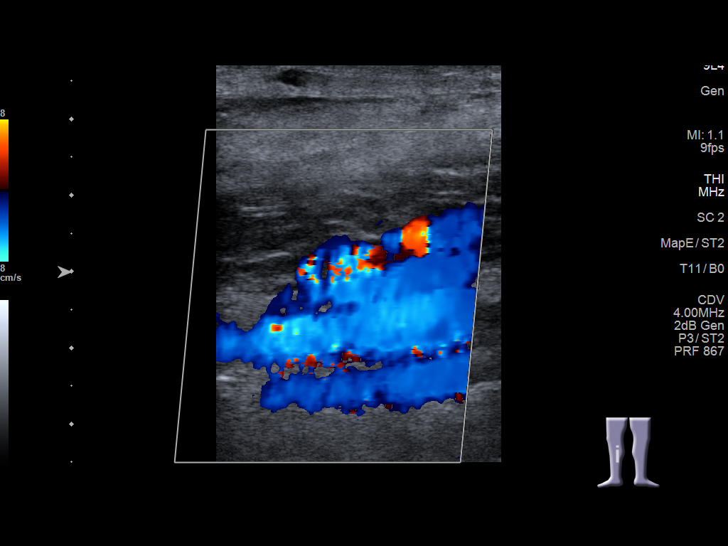

[13 of 24 positions shown; findings below may reference images not displayed]

FINDINGS: VENOUS

Normal compressibility of the common femoral, superficial femoral,
and popliteal veins, as well as the visualized calf veins.
Visualized portions of profunda femoral vein and great saphenous
vein unremarkable. No filling defects to suggest DVT on grayscale or
color Doppler imaging. Doppler waveforms show normal direction of
venous flow, normal respiratory phasicity and response to
augmentation.

Limited views of the contralateral common femoral vein demonstrate
hypoechoic mural thrombus with resultant incomplete compressibility,
continued antegrade flow throughout on color Doppler.

OTHER

None.

Limitations: none
IMPRESSION: 1. Negative for right lower extremity DVT.
2. Findings suspicious for chronic post thrombotic change on limited
images of the left common femoral vein.

## 2024-01-24 ENCOUNTER — Other Ambulatory Visit: Payer: Self-pay | Admitting: Orthopedic Surgery

## 2024-01-24 DIAGNOSIS — M75101 Unspecified rotator cuff tear or rupture of right shoulder, not specified as traumatic: Secondary | ICD-10-CM

## 2024-01-24 DIAGNOSIS — M19011 Primary osteoarthritis, right shoulder: Secondary | ICD-10-CM

## 2024-01-28 ENCOUNTER — Ambulatory Visit
Admission: RE | Admit: 2024-01-28 | Discharge: 2024-01-28 | Disposition: A | Discharge status: Home / Self Care | Payer: Medicare Other | Source: Ambulatory Visit | Attending: Orthopedic Surgery | Admitting: Orthopedic Surgery

## 2024-01-28 DIAGNOSIS — M19011 Primary osteoarthritis, right shoulder: Secondary | ICD-10-CM

## 2024-01-28 DIAGNOSIS — M75101 Unspecified rotator cuff tear or rupture of right shoulder, not specified as traumatic: Secondary | ICD-10-CM

## 2024-02-29 ENCOUNTER — Other Ambulatory Visit: Payer: Self-pay | Admitting: Orthopedic Surgery

## 2024-03-01 ENCOUNTER — Encounter: Payer: Self-pay | Admitting: Orthopedic Surgery

## 2024-03-02 NOTE — Anesthesia Preprocedure Evaluation (Addendum)
 Anesthesia Evaluation  Patient identified by MRN, date of birth, ID band Patient awake    Reviewed: Allergy & Precautions, H&P , NPO status , Patient's Chart, lab work & pertinent test results  Airway Mallampati: III  TM Distance: >3 FB Neck ROM: Full    Dental no notable dental hx.    Pulmonary neg pulmonary ROS, former smoker   Pulmonary exam normal breath sounds clear to auscultation       Cardiovascular hypertension, negative cardio ROS Normal cardiovascular exam Rhythm:Regular Rate:Normal     Neuro/Psych negative neurological ROS  negative psych ROS   GI/Hepatic negative GI ROS, Neg liver ROS,GERD  ,,  Endo/Other  negative endocrine ROSdiabetes    Renal/GU negative Renal ROS  negative genitourinary   Musculoskeletal negative musculoskeletal ROS (+)    Abdominal   Peds negative pediatric ROS (+)  Hematology negative hematology ROS (+)   Anesthesia Other Findings HTN GERD Diabetes mellitus   Reproductive/Obstetrics negative OB ROS                             Anesthesia Physical Anesthesia Plan  ASA: 2  Anesthesia Plan: General ETT   Post-op Pain Management:    Induction: Intravenous  PONV Risk Score and Plan:   Airway Management Planned: Oral ETT  Additional Equipment:   Intra-op Plan:   Post-operative Plan: Extubation in OR  Informed Consent: I have reviewed the patients History and Physical, chart, labs and discussed the procedure including the risks, benefits and alternatives for the proposed anesthesia with the patient or authorized representative who has indicated his/her understanding and acceptance.     Dental Advisory Given  Plan Discussed with: Anesthesiologist, CRNA and Surgeon  Anesthesia Plan Comments: (Patient consented for risks of anesthesia including but not limited to:  - adverse reactions to medications - damage to eyes, teeth, lips or other  oral mucosa - nerve damage due to positioning  - sore throat or hoarseness - Damage to heart, brain, nerves, lungs, other parts of body or loss of life  Patient voiced understanding and assent.)        Anesthesia Quick Evaluation

## 2024-03-09 ENCOUNTER — Encounter: Payer: Self-pay | Admitting: Orthopedic Surgery

## 2024-03-09 ENCOUNTER — Encounter
Admission: RE | Disposition: A | Discharge status: Home / Self Care | Payer: Self-pay | Source: Home / Self Care | Attending: Orthopedic Surgery

## 2024-03-09 ENCOUNTER — Other Ambulatory Visit: Payer: Self-pay

## 2024-03-09 ENCOUNTER — Ambulatory Visit: Payer: Self-pay | Admitting: Anesthesiology

## 2024-03-09 ENCOUNTER — Ambulatory Visit
Admission: RE | Admit: 2024-03-09 | Discharge: 2024-03-09 | Disposition: A | Discharge status: Home / Self Care | Attending: Orthopedic Surgery | Admitting: Orthopedic Surgery

## 2024-03-09 DIAGNOSIS — K219 Gastro-esophageal reflux disease without esophagitis: Secondary | ICD-10-CM | POA: Diagnosis not present

## 2024-03-09 DIAGNOSIS — M7541 Impingement syndrome of right shoulder: Secondary | ICD-10-CM | POA: Diagnosis not present

## 2024-03-09 DIAGNOSIS — I10 Essential (primary) hypertension: Secondary | ICD-10-CM | POA: Insufficient documentation

## 2024-03-09 DIAGNOSIS — Z87891 Personal history of nicotine dependence: Secondary | ICD-10-CM | POA: Diagnosis not present

## 2024-03-09 DIAGNOSIS — M7521 Bicipital tendinitis, right shoulder: Secondary | ICD-10-CM | POA: Insufficient documentation

## 2024-03-09 DIAGNOSIS — M19011 Primary osteoarthritis, right shoulder: Secondary | ICD-10-CM | POA: Diagnosis not present

## 2024-03-09 DIAGNOSIS — M75121 Complete rotator cuff tear or rupture of right shoulder, not specified as traumatic: Secondary | ICD-10-CM | POA: Insufficient documentation

## 2024-03-09 DIAGNOSIS — E119 Type 2 diabetes mellitus without complications: Secondary | ICD-10-CM | POA: Diagnosis not present

## 2024-03-09 HISTORY — PX: SHOULDER ARTHROSCOPY WITH ROTATOR CUFF REPAIR: SHX5685

## 2024-03-09 HISTORY — DX: Type 2 diabetes mellitus without complications: E11.9

## 2024-03-09 HISTORY — PX: SUBACROMIAL DECOMPRESSION: SHX5174

## 2024-03-09 HISTORY — PX: BICEPS TENDON REPAIR: SHX566

## 2024-03-09 HISTORY — PX: POSTERIOR LUMBAR FUSION 2 WITH HARDWARE REMOVAL: SHX7297

## 2024-03-09 HISTORY — PX: BICEPT TENODESIS: SHX5116

## 2024-03-09 SURGERY — ARTHROSCOPY, SHOULDER WITH DEBRIDEMENT
Anesthesia: General | Site: Shoulder | Laterality: Right

## 2024-03-09 MED ORDER — ONDANSETRON HCL 4 MG/2ML IJ SOLN
INTRAMUSCULAR | Status: AC
Start: 2024-03-09 — End: ?
  Filled 2024-03-09: qty 2

## 2024-03-09 MED ORDER — OXYCODONE HCL 5 MG PO TABS
10.0000 mg | ORAL_TABLET | Freq: Once | ORAL | Status: AC
  Administered 2024-03-09: 10 mg via ORAL

## 2024-03-09 MED ORDER — DEXAMETHASONE SODIUM PHOSPHATE 4 MG/ML IJ SOLN
INTRAMUSCULAR | Status: AC
  Filled 2024-03-09: qty 1

## 2024-03-09 MED ORDER — CEFAZOLIN SODIUM-DEXTROSE 2-3 GM-%(50ML) IV SOLR
INTRAVENOUS | Status: AC
  Filled 2024-03-09: qty 50

## 2024-03-09 MED ORDER — ASPIRIN 325 MG PO TBEC
325.0000 mg | DELAYED_RELEASE_TABLET | Freq: Every day | ORAL | 0 refills | Status: AC
Start: 2024-03-09 — End: 2024-03-23

## 2024-03-09 MED ORDER — MIDAZOLAM HCL 2 MG/2ML IJ SOLN
INTRAMUSCULAR | Status: AC
  Filled 2024-03-09: qty 2

## 2024-03-09 MED ORDER — SUCCINYLCHOLINE CHLORIDE 200 MG/10ML IV SOSY
PREFILLED_SYRINGE | INTRAVENOUS | Status: DC | PRN
  Administered 2024-03-09: 140 mg via INTRAVENOUS

## 2024-03-09 MED ORDER — PHENYLEPHRINE 80 MCG/ML (10ML) SYRINGE FOR IV PUSH (FOR BLOOD PRESSURE SUPPORT)
PREFILLED_SYRINGE | INTRAVENOUS | Status: AC
  Filled 2024-03-09: qty 10

## 2024-03-09 MED ORDER — FENTANYL CITRATE (PF) 100 MCG/2ML IJ SOLN
INTRAMUSCULAR | Status: DC | PRN
  Administered 2024-03-09: 100 ug via INTRAVENOUS

## 2024-03-09 MED ORDER — OXYCODONE HCL 5 MG PO TABS
ORAL_TABLET | ORAL | Status: AC
  Filled 2024-03-09: qty 2

## 2024-03-09 MED ORDER — LACTATED RINGERS IV SOLN
INTRAVENOUS | Status: DC | PRN
  Administered 2024-03-09: 4 mL

## 2024-03-09 MED ORDER — LACTATED RINGERS IR SOLN
Status: DC | PRN
  Administered 2024-03-09: 30000 mL

## 2024-03-09 MED ORDER — PROPOFOL 10 MG/ML IV BOLUS
INTRAVENOUS | Status: DC | PRN
  Administered 2024-03-09: 200 mg via INTRAVENOUS

## 2024-03-09 MED ORDER — ACETAMINOPHEN 500 MG PO TABS
1000.0000 mg | ORAL_TABLET | Freq: Three times a day (TID) | ORAL | 2 refills | Status: AC
Start: 2024-03-09 — End: 2025-03-09

## 2024-03-09 MED ORDER — EPHEDRINE 5 MG/ML INJ
INTRAVENOUS | Status: AC
  Filled 2024-03-09: qty 5

## 2024-03-09 MED ORDER — FENTANYL CITRATE (PF) 100 MCG/2ML IJ SOLN: 100.0000 ug | Freq: Once | INTRAMUSCULAR | Status: DC

## 2024-03-09 MED ORDER — LACTATED RINGERS IV SOLN: INTRAVENOUS | Status: DC

## 2024-03-09 MED ORDER — FENTANYL CITRATE (PF) 100 MCG/2ML IJ SOLN
INTRAMUSCULAR | Status: AC
  Filled 2024-03-09: qty 2

## 2024-03-09 MED ORDER — BUPIVACAINE HCL (PF) 0.25 % IJ SOLN
INTRAMUSCULAR | Status: DC | PRN
  Administered 2024-03-09: 20 mL via PERINEURAL

## 2024-03-09 MED ORDER — LIDOCAINE HCL (CARDIAC) PF 100 MG/5ML IV SOSY
PREFILLED_SYRINGE | INTRAVENOUS | Status: DC | PRN
  Administered 2024-03-09: 100 mg via INTRAVENOUS

## 2024-03-09 MED ORDER — OXYCODONE HCL 5 MG PO TABS: 5.0000 mg | ORAL_TABLET | ORAL | 0 refills | Status: AC | PRN

## 2024-03-09 MED ORDER — ONDANSETRON 4 MG PO TBDP: 4.0000 mg | ORAL_TABLET | Freq: Three times a day (TID) | ORAL | 0 refills | Status: AC | PRN

## 2024-03-09 MED ORDER — BUPIVACAINE LIPOSOME 1.3 % IJ SUSP
INTRAMUSCULAR | Status: DC | PRN
  Administered 2024-03-09: 10 mL via PERINEURAL

## 2024-03-09 MED ORDER — BUPIVACAINE LIPOSOME 1.3 % IJ SUSP
INTRAMUSCULAR | Status: AC
Start: 2024-03-09 — End: ?
  Filled 2024-03-09: qty 10

## 2024-03-09 MED ORDER — SUCCINYLCHOLINE CHLORIDE 200 MG/10ML IV SOSY
PREFILLED_SYRINGE | INTRAVENOUS | Status: AC
  Filled 2024-03-09: qty 10

## 2024-03-09 MED ORDER — LIDOCAINE HCL (PF) 2 % IJ SOLN
INTRAMUSCULAR | Status: AC
  Filled 2024-03-09: qty 2

## 2024-03-09 MED ORDER — BUPIVACAINE HCL 0.25 % IJ SOLN
INTRAMUSCULAR | Status: AC
  Filled 2024-03-09: qty 1

## 2024-03-09 MED ORDER — CEFAZOLIN SODIUM-DEXTROSE 2-3 GM-%(50ML) IV SOLR: INTRAVENOUS | Status: DC | PRN

## 2024-03-09 MED ORDER — LIDOCAINE HCL (PF) 1 % IJ SOLN
INTRAMUSCULAR | Status: DC | PRN
  Administered 2024-03-09: 1 mL via SUBCUTANEOUS

## 2024-03-09 MED ORDER — DEXAMETHASONE SODIUM PHOSPHATE 10 MG/ML IJ SOLN
INTRAMUSCULAR | Status: AC
  Filled 2024-03-09: qty 1

## 2024-03-09 MED ORDER — CEFAZOLIN SODIUM-DEXTROSE 2-4 GM/100ML-% IV SOLN
2.0000 g | INTRAVENOUS | Status: AC
  Administered 2024-03-09: 2 g via INTRAVENOUS

## 2024-03-09 MED ORDER — LIDOCAINE HCL (PF) 2 % IJ SOLN
INTRAMUSCULAR | Status: AC
  Filled 2024-03-09: qty 5

## 2024-03-09 MED ORDER — ONDANSETRON HCL 4 MG/2ML IJ SOLN
INTRAMUSCULAR | Status: DC | PRN
Start: 2024-03-09 — End: 2024-03-09
  Administered 2024-03-09: 4 mg via INTRAVENOUS

## 2024-03-09 MED ORDER — DEXAMETHASONE SODIUM PHOSPHATE 10 MG/ML IJ SOLN
INTRAMUSCULAR | Status: DC | PRN
  Administered 2024-03-09: 10 mg

## 2024-03-09 MED ORDER — PROPOFOL 10 MG/ML IV BOLUS
INTRAVENOUS | Status: AC
  Filled 2024-03-09: qty 20

## 2024-03-09 MED ORDER — MIDAZOLAM HCL 2 MG/2ML IJ SOLN
2.0000 mg | Freq: Once | INTRAMUSCULAR | Status: AC
  Administered 2024-03-09: 2 mg via INTRAVENOUS

## 2024-03-09 MED ORDER — EPHEDRINE SULFATE (PRESSORS) 50 MG/ML IJ SOLN
INTRAMUSCULAR | Status: DC | PRN
  Administered 2024-03-09: 7.5 mg via INTRAVENOUS
  Administered 2024-03-09: 5 mg via INTRAVENOUS
  Administered 2024-03-09: 10 mg via INTRAVENOUS
  Administered 2024-03-09: 5 mg via INTRAVENOUS
  Administered 2024-03-09: 7.5 mg via INTRAVENOUS

## 2024-03-09 MED ORDER — PHENYLEPHRINE HCL (PRESSORS) 10 MG/ML IV SOLN
INTRAVENOUS | Status: DC | PRN
  Administered 2024-03-09: 80 ug via INTRAVENOUS
  Administered 2024-03-09: 120 ug via INTRAVENOUS

## 2024-03-09 SURGICAL SUPPLY — 48 items
ANCHOR 2.3 SP SGL 1.2 XBRAID (Anchor) IMPLANT
ANCHOR SWIVELOCK BIO 4.75X19.1 (Anchor) IMPLANT
BLADE SHAVER 4.5X7 STR FR (MISCELLANEOUS) ×2 IMPLANT
BUR BR 5.5 WIDE MOUTH (BURR) ×2 IMPLANT
CANNULA PART THRD DISP 5.75X7 (CANNULA) IMPLANT
CANNULA PARTIAL THREAD 2X7 (CANNULA) IMPLANT
CANNULA TWIST IN 8.25X7CM (CANNULA) IMPLANT
CHLORAPREP W/TINT 26 (MISCELLANEOUS) ×2 IMPLANT
COOLER POLAR GLACIER W/PUMP (MISCELLANEOUS) ×2 IMPLANT
COVER LIGHT HANDLE UNIVERSAL (MISCELLANEOUS) ×4 IMPLANT
DERMABOND ADVANCED .7 DNX12 (GAUZE/BANDAGES/DRESSINGS) IMPLANT
DRAPE INCISE IOBAN 66X45 STRL (DRAPES) ×2 IMPLANT
DRAPE STERI 35X30 U-POUCH (DRAPES) ×2 IMPLANT
DRAPE U-SHAPE 48X52 POLY STRL (PACKS) ×2 IMPLANT
DRSG TEGADERM 4X4.75 (GAUZE/BANDAGES/DRESSINGS) ×6 IMPLANT
ELECT REM PT RETURN 9FT ADLT (ELECTROSURGICAL) IMPLANT
ELECTRODE REM PT RTRN 9FT ADLT (ELECTROSURGICAL) IMPLANT
GAUZE SPONGE 4X4 12PLY STRL (GAUZE/BANDAGES/DRESSINGS) ×2 IMPLANT
GAUZE XEROFORM 1X8 LF (GAUZE/BANDAGES/DRESSINGS) ×2 IMPLANT
GLOVE SRG 8 PF TXTR STRL LF DI (GLOVE) ×6 IMPLANT
GLOVE SURG ENC MOIS LTX SZ7.5 (GLOVE) ×8 IMPLANT
GOWN STRL REIN 2XL XLG LVL4 (GOWN DISPOSABLE) ×2 IMPLANT
GOWN STRL REUS W/ TWL LRG LVL3 (GOWN DISPOSABLE) ×6 IMPLANT
IV LR IRRIG 3000ML ARTHROMATIC (IV SOLUTION) ×8 IMPLANT
KIT STABILIZATION SHOULDER (MISCELLANEOUS) ×2 IMPLANT
KIT TURNOVER KIT A (KITS) ×2 IMPLANT
MANIFOLD NEPTUNE II (INSTRUMENTS) ×2 IMPLANT
MASK FACE SPIDER DISP (MASK) ×2 IMPLANT
MAT GRAY ABSORB FLUID 28X50 (MISCELLANEOUS) ×4 IMPLANT
PACK ARTHROSCOPY SHOULDER (MISCELLANEOUS) ×2 IMPLANT
PAD ABD DERMACEA PRESS 5X9 (GAUZE/BANDAGES/DRESSINGS) ×4 IMPLANT
PAD WRAPON POLAR SHDR XLG (MISCELLANEOUS) ×2 IMPLANT
PASSER SUT FIRSTPASS SELF (INSTRUMENTS) IMPLANT
SET Y ADAPTER MULIT-BAG IRRIG (MISCELLANEOUS) ×2 IMPLANT
SPONGE T-LAP 18X18 ~~LOC~~+RFID (SPONGE) ×2 IMPLANT
SUT ETHILON 3-0 (SUTURE) IMPLANT
SUT ETHILON 3-0 FS-10 30 BLK (SUTURE) ×4 IMPLANT
SUT MNCRL 4-0 27 PS-2 XMFL (SUTURE) IMPLANT
SUT VIC AB 0 CT1 36 (SUTURE) ×2 IMPLANT
SUT VIC AB 2-0 CT2 27 (SUTURE) IMPLANT
SUTURE EHLN 3-0 FS-10 30 BLK (SUTURE) ×2 IMPLANT
SUTURE MNCRL 4-0 27XMF (SUTURE) IMPLANT
TAPE MICROFOAM 4IN (TAPE) ×2 IMPLANT
TUBING CONNECTING 10 (TUBING) ×2 IMPLANT
TUBING INFLOW SET DBFLO PUMP (TUBING) ×2 IMPLANT
TUBING OUTFLOW SET DBLFO PUMP (TUBING) ×2 IMPLANT
WAND WEREWOLF FLOW 90D (MISCELLANEOUS) ×2 IMPLANT
WRAPON POLAR PAD SHDR XLG (MISCELLANEOUS) ×2 IMPLANT

## 2024-03-09 NOTE — Anesthesia Procedure Notes (Signed)
 Anesthesia Regional Block: Interscalene brachial plexus block   Pre-Anesthetic Checklist: , timeout performed,  Correct Patient, Correct Site, Correct Laterality,  Correct Procedure, Correct Position, site marked,  Risks and benefits discussed,  Surgical consent,  Pre-op evaluation,  At surgeon's request and post-op pain management  Laterality: Right and Upper  Prep: chloraprep       Needles:  Injection technique: Single-shot  Needle Type: Stimiplex     Needle Length: 10cm  Needle Gauge: 21     Additional Needles:   Procedures:,,,, ultrasound used (permanent image in chart),,    Narrative:  Start time: 03/09/2024 10:08 AM End time: 03/09/2024 10:16 AM Injection made incrementally with aspirations every 5 mL.  Performed by: Personally  Anesthesiologist: Marisue Humble, MD  Additional Notes: Functioning IV was confirmed and monitors applied. Ultrasound guidance: relevant anatomy identified, needle position confirmed, local anesthetic spread visualized around nerve(s)., vascular puncture avoided.  Image printed for medical record.  Negative aspiration and no paresthesias; incremental administration of local anesthetic. The patient tolerated the procedure well. Vitals signs recorded in RN notes. Also superficial cervical plexus block and intercostobrachial blocks as field blocks without ultrasound.

## 2024-03-09 NOTE — Anesthesia Postprocedure Evaluation (Signed)
 Anesthesia Post Note  Patient: Mark SANTANA Sr.  Procedure(s) Performed: ARTHROSCOPY, SHOULDER WITH DEBRIDEMENT (Right) shoulder (Right) DECOMPRESSION, SUBACROMIAL SPACE (Right: Shoulder) ARTHROSCOPY, SHOULDER, WITH ROTATOR CUFF REPAIR (Right: Shoulder)  Patient location during evaluation: PACU Anesthesia Type: General Level of consciousness: awake and alert Pain management: pain level controlled Vital Signs Assessment: post-procedure vital signs reviewed and stable Respiratory status: spontaneous breathing, nonlabored ventilation, respiratory function stable and patient connected to nasal cannula oxygen Cardiovascular status: stable and blood pressure returned to baseline Postop Assessment: no apparent nausea or vomiting Anesthetic complications: no   No notable events documented.   Last Vitals:  Vitals:   03/09/24 1415 03/09/24 1430  BP: 120/70 126/64  Pulse: 74 73  Resp: 14 15  Temp:    SpO2: 95% 96%    Last Pain:  Vitals:   03/09/24 1419  TempSrc:   PainSc: 3                  [name]

## 2024-03-09 NOTE — Transfer of Care (Signed)
 Immediate Anesthesia Transfer of Care Note  Patient: Mark BARKAN Sr.  Procedure(s) Performed: ARTHROSCOPY, SHOULDER WITH DEBRIDEMENT (Right) REPAIR, SHOULDER, SUBSCAPULAR, OPEN (Right)  Patient Location: PACU  Anesthesia Type: General ETT  Level of Consciousness: awake, alert  and patient cooperative  Airway and Oxygen Therapy: Patient Spontanous Breathing and Patient connected to supplemental oxygen  Post-op Assessment: Post-op Vital signs reviewed, Patient's Cardiovascular Status Stable, Respiratory Function Stable, Patent Airway and No signs of Nausea or vomiting  Post-op Vital Signs: Reviewed and stable  Complications: No notable events documented.

## 2024-03-09 NOTE — Op Note (Signed)
 SURGERY DATE: 03/09/2024   PRE-OP DIAGNOSIS:  1. Right subacromial impingement 2. Right biceps tendinopathy 3. Right rotator cuff tear 4. Right acromioclavicular joint arthritis  POST-OP DIAGNOSIS: 1. Right subacromial impingement 2. Right biceps tendinopathy 3. Right rotator cuff tear 4. Right acromioclavicular joint arthritis  PROCEDURES:  1. Right arthroscopic rotator cuff repair (full-thickness subscapularis and supraspinatus) 2. Right arthroscopic biceps tenodesis 3. Right arthroscopic subacromial decompression 4. Right arthroscopic extensive debridement of shoulder (glenohumeral and subacromial spaces) 5. Right arthroscopic distal clavicle excision   SURGEON: Rosealee Albee, MD   ASSISTANT: Sonny Dandy, PA   ANESTHESIA: Gen with Exparel interscalene block   ESTIMATED BLOOD LOSS: 5cc   DRAINS:  none   TOTAL IV FLUIDS: per anesthesia      SPECIMENS: none   IMPLANTS:  - Arthrex 2.16mm PushLock x 1 - Arthrex 4.81mm SwiveLock x 2 - Iconix SPEED double loaded with 1.2 and 2.57mm tape x 1     OPERATIVE FINDINGS:  Examination under anesthesia: A careful examination under anesthesia was performed.  Passive range of motion was: FF: 150; ER at side: 45; ER in abduction: 90; IR in abduction: 45.  Anterior load shift: NT.  Posterior load shift: NT.  Sulcus in neutral: NT.  Sulcus in ER: NT.     Intra-operative findings: A thorough arthroscopic examination of the shoulder was performed.  The findings are: 1. Biceps tendon: medial subluxation with split-thickness tearing 2. Superior labrum: erythema 3. Posterior labrum and capsule: normal 4. Inferior capsule and inferior recess: normal 5. Glenoid cartilage surface: Diffuse grade 2 degenerative changes 6. Supraspinatus attachment: full-thickness tear of the anterior supraspinatus 7. Posterior rotator cuff attachment: normal 8. Humeral head articular cartilage: Scattered grade 1-2 degenerative changes 9. Rotator interval:  significant synovitis 10: Subscapularis tendon: full-thickness tear with mild retraction 11. Anterior labrum: Mildly degenerative 12. IGHL: normal   OPERATIVE REPORT:    Indications for procedure:  Mark Fisher. is a 73 y.o. male with approximately 6 months of right shoulder pain. He has had difficulty with overhead motion since that time with sensations of weakness. He has undergone extensive nonoperative management without improvement in his symptoms. Clinical exam and MRI were suggestive of rotator cuff tear, biceps tendinopathy, acromioclavicular joint arthritis and subacromial impingement. After discussion of risks, benefits, and alternatives to surgery, the patient elected to proceed.    Procedure in detail:   I identified IWAO SHAMBLIN Sr. in the pre-operative holding area. I marked the operative shoulder with my initials. I reviewed the risks and benefits of the proposed surgical intervention, and the patient wished to proceed.  Anesthesia was then performed with an Exparel interscalene block. The patient was transferred to the operative suite and placed in the beach chair position.     Appropriate IV antibiotics were administered prior to incision. The operative upper extremity was then prepped and draped in standard fashion. A time out was performed confirming the correct extremity, correct patient, and correct procedure.    I then created a standard posterior portal with an 11 blade. The glenohumeral joint was easily entered with a blunt trocar and the arthroscope introduced. The findings of diagnostic arthroscopy are described above. I debrided degenerative tissue including the synovitic tissue about the rotator interval and anterior and superior labrum. I also debrided the chondral surface of the glenoid to ensure stable cartilage edges. I then coagulated the inflamed synovium to obtain hemostasis and reduce the risk of post-operative swelling using an Arthrocare radiofrequency  device.  I then turned my attention to the arthroscopic biceps tenodesis. The Loop n Tack technique was used to pass a FiberTape through the biceps in a locked fashion adjacent to the biceps anchor.  A hole for a 2.9 mm Arthrex PushLock was drilled in the bicipital groove just superior to the subscapularis tendon insertion.  The biceps tendon was then cut and the biceps anchor complex was debrided down to a stable base on the superior labrum.  The FiberTape was loaded onto the PushLock anchor and impacted into place into the previously drilled hole in the bicipital groove.  This appropriately secured the biceps into the bicipital groove and took it off of tension.     The subscapularis tear was identified.  A superior anterolateral portal was made under needle localization.  A cannula was placed.  The comma tissue indicating the superolateral border of the subscapularis was identified readily.  The tip of the coracoid as well as the conjoined tendon and coracoacromial ligaments were visualized after debriding rotator interval tissue.  Tissue about the subscapularis was released anteriorly, superiorly, and posteriorly to allow for improved mobilization.  The comma tissue was tagged with a FiberWire suture and tension on this allowed for appropriate mobilization of the subscapularis.  The lesser tuberosity footprint was prepared with a combination of electrocautery and burr. 2 suture tapes were passed in a mattress fashion.  The traction stitch was removed.  All 4 strands of suture were then loaded onto a 4.75 mm SwiveLock anchor and placed into the prepared footprint with the arm in a neutral position. The knotless mechanism was utilized to further reduce the upper border. This construct appropriately reduced the subscapularis tear.  The arm was then internally and externally rotated and the subscapularis was noted to move appropriately with rotation.  The remainder of the suture was then cut.  Next, the  arthroscope was then introduced into the subacromial space. A direct lateral portal was created with an 11-blade after spinal needle localization. An extensive subacromial bursectomy and debridement was performed using a combination of the shaver and Arthrocare wand. The entire acromial undersurface was exposed and the CA ligament was subperiosteally elevated to expose the anterior acromial hook. A burr was used to create a flat anterior and lateral aspect of the acromion, converting it from a Type 2 to a Type 1 acromion. Care was made to keep the deltoid fascia intact.  I then turned my attention to the arthroscopic distal clavicle excision. I identified the acromioclavicular joint. Surrounding bursal tissue was debrided and the edges of the joint were identified. I used the 5.58mm barrel burr to remove the distal clavicle parallel to the edge of the acromion. I was able to fit two widths of the burr into the space between the distal clavicle and acromion, signifying that I had removed ~39mm of distal clavicle. This was confirmed by viewing anteriorly and introducing a probe with measuring marks from the lateral portal. Hemostasis was achieved with an Arthrocare wand.    Next, I created an accessory posterolateral portal to assist with visualization and instrumentation.  I debrided the poor quality edges of the supraspinatus tendon.  This was an L-shaped tear of the supraspinatus with the long limb anterior. I prepared the footprint using a burr to expose bleeding bone.    I then percutaneously placed 1 Iconix SPEED medial row anchor at the articular margin. I then shuttled all 4 strands of tape through the rotator cuff just lateral to the musculotendinous junction using  a FirstPass suture passer spanning the anterior to posterior extent of the tear. All 4 strands of suture were passed through an [employer] anchor.  This was placed approximately 2 cm distal to the lateral edge of the footprint anterior  to the anterior portion of the tear with appropriate tensioning of each suture prior to final fixation. The knotless mechanism of the SwiveLock anchor was utilized to further reduce the anterior cable. This construct allowed for excellent reapproximation of the rotator cuff to its native footprint without undue tension.  Appropriate compression was achieved. The repair was stable to external and internal rotation.   Fluid was evacuated from the shoulder, and the portals were closed with 3-0 Nylon. Xeroform was applied to the portals. A sterile dressing was applied, followed by a Polar Care sleeve and a SlingShot shoulder immobilizer/sling. The patient was awakened from anesthesia without difficulty and was transferred to the PACU in stable condition.    Of note, assistance from a PA was essential to performing the surgery.  PA was present for the entire surgery.  PA assisted with patient positioning, retraction, instrumentation, and wound closure. The surgery would have been more difficult and had longer operative time without PA assistance.   COMPLICATIONS: none   DISPOSITION: plan for discharge home after recovery in PACU     POSTOPERATIVE PLAN: Remain in sling (except hygiene and elbow/wrist/hand RoM exercises as instructed by PT) x 4 weeks and NWB for this time. PT to begin 3-4 days after surgery.  Large rotator cuff repair rehab protocol with subscapularis restrictions. ASA 325mg  daily x 2 weeks for DVT ppx.

## 2024-03-09 NOTE — Discharge Instructions (Addendum)
 Post-Op Instructions - Rotator Cuff Repair  1. Bracing: You will wear a shoulder immobilizer or sling for 6 weeks.   2. Driving: No driving for 3 weeks post-op. When driving, do not wear the immobilizer. Ideally, we recommend no driving for 6 weeks while sling is in place as one arm will be immobilized.   3. Activity: No active lifting for 2 months. Wrist, hand, and elbow motion only. Avoid lifting the upper arm away from the body except for hygiene. You are permitted to bend and straighten the elbow passively only (no active elbow motion). You may use your hand and wrist for typing, writing, and managing utensils (cutting food). Do not lift more than a coffee cup for 8 weeks.  When sleeping or resting, inclined positions (recliner chair or wedge pillow) and a pillow under the forearm for support may provide better comfort for up to 4 weeks.  Avoid long distance travel for 4 weeks.  Return to normal activities after rotator cuff repair repair normally takes 6 months on average. If rehab goes very well, may be able to do most activities at 4 months, except overhead or contact sports.  4. Physical Therapy: Begins 3-4 days after surgery, and proceed 1 time per week for the first 6 weeks, then 1-2 times per week from weeks 6-20 post-op.  5. Medications:  - You will be provided a prescription for narcotic pain medicine. After surgery, take 1-2 narcotic tablets every 4 hours if needed for severe pain.  - A prescription for anti-nausea medication will be provided in case the narcotic medicine causes nausea - take 1 tablet every 6 hours only if nauseated.   - Take tylenol 1000 mg (2 Extra Strength tablets or 3 regular strength) every 8 hours for pain.  May decrease or stop tylenol 5 days after surgery if you are having minimal pain. - Take ASA 325mg /day x 2 weeks to help prevent DVTs/PEs (blood clots).  - DO NOT take ANY nonsteroidal anti-inflammatory pain medications (Advil, Motrin, Ibuprofen, Aleve,  Naproxen, or Naprosyn). These medicines can inhibit healing of your shoulder repair.    If you are taking prescription medication for anxiety, depression, insomnia, muscle spasm, chronic pain, or for attention deficit disorder, you are advised that you are at a higher risk of adverse effects with use of narcotics post-op, including narcotic addiction/dependence, depressed breathing, death. If you use non-prescribed substances: alcohol, marijuana, cocaine, heroin, methamphetamines, etc., you are at a higher risk of adverse effects with use of narcotics post-op, including narcotic addiction/dependence, depressed breathing, death. You are advised that taking > 50 morphine milligram equivalents (MME) of narcotic pain medication per day results in twice the risk of overdose or death. For your prescription provided: oxycodone 5 mg - taking more than 6 tablets per day would result in > 50 morphine milligram equivalents (MME) of narcotic pain medication. Be advised that we will prescribe narcotics short-term, for acute post-operative pain only - 3 weeks for major operations such as shoulder repair/reconstruction surgeries.     6. Post-Op Appointment:  Your first post-op appointment will be 10-14 days post-op.  7. Work or School: For most, but not all procedures, we advise staying out of work or school for at least 1 to 2 weeks in order to recover from the stress of surgery and to allow time for healing.   If you need a work or school note this can be provided.   8. Smoking: If you are a smoker, you need to refrain from  smoking in the postoperative period. The nicotine in cigarettes will inhibit healing of your shoulder repair and decrease the chance of successful repair. Similarly, nicotine containing products (gum, patches) should be avoided.   Post-operative Brace: Apply and remove the brace you received as you were instructed to at the time of fitting and as described in detail as the brace's  instructions for use indicate.  Wear the brace for the period of time prescribed by your physician.  The brace can be cleaned with soap and water and allowed to air dry only.  Should the brace result in increased pain, decreased feeling (numbness/tingling), increased swelling or an overall worsening of your medical condition, please contact your doctor immediately.  If an emergency situation occurs as a result of wearing the brace after normal business hours, please dial 911 and seek immediate medical attention.  Let your doctor know if you have any further questions about the brace issued to you. Refer to the shoulder sling instructions for use if you have any questions regarding the correct fit of your shoulder sling.  [facility] Customer Care for Troubleshooting: [phone]  Video that illustrates how to properly use a shoulder sling: "Instructions for Proper Use of an Orthopaedic Sling" http://bass.com/        POLAR CARE INFORMATION  MassAdvertisement.it  How to use [employer] Polar Care [facility] Therapy System?  YouTube   ShippingScam.co.uk  OPERATING INSTRUCTIONS  Start the product With dry hands, connect the transformer to the electrical connection located on the top of the cooler. Next, plug the transformer into an appropriate electrical outlet. The unit will automatically start running at this point.  To stop the pump, disconnect electrical power.  Unplug to stop the product when not in use. Unplugging the Polar Care unit turns it off. Always unplug immediately after use. Never leave it plugged in while unattended. Remove pad.    FIRST ADD WATER TO FILL LINE, THEN ICE---Replace ice when existing ice is almost melted  1 Discuss Treatment with your Licensed Health Care Practitioner and Use Only as Prescribed 2 Apply Insulation Barrier & Cold Therapy Pad 3 Check for Moisture 4 Inspect Skin Regularly  Tips and Trouble Shooting Usage  Tips 1. Use cubed or chunked ice for optimal performance. 2. It is recommended to drain the Pad between uses. To drain the pad, hold the Pad upright with the hose pointed toward the ground. Depress the black plunger and allow water to drain out. 3. You may disconnect the Pad from the unit without removing the pad from the affected area by depressing the silver tabs on the hose coupling and gently pulling the hoses apart. The Pad and unit will seal itself and will not leak. Note: Some dripping during release is normal. 4. DO NOT RUN PUMP WITHOUT WATER! The pump in this unit is designed to run with water. Running the unit without water will cause permanent damage to the pump. 5. Unplug unit before removing lid.  TROUBLESHOOTING GUIDE Pump not running, Water not flowing to the pad, Pad is not getting cold 1. Make sure the transformer is plugged into the wall outlet. 2. Confirm that the ice and water are filled to the indicated levels. 3. Make sure there are no kinks in the pad. 4. Gently pull on the blue tube to make sure the tube/pad junction is straight. 5. Remove the pad from the treatment site and ll it while the pad is lying at; then reapply. 6. Confirm that the pad couplings are  securely attached to the unit. Listen for the double clicks (Figure 1) to confirm the pad couplings are securely attached.  Leaks    Note: Some condensation on the lines, controller, and pads is unavoidable, especially in warmer climates. 1. If using a Breg Polar Care Cold Therapy unit with a detachable Cold Therapy Pad, and a leak exists (other than condensation on the lines) disconnect the pad couplings. Make sure the silver tabs on the couplings are depressed before reconnecting the pad to the pump hose; then confirm both sides of the coupling are properly clicked in. 2. If the coupling continues to leak or a leak is detected in the pad itself, stop using it and call B[employer] Customer Care at [phone]  [phone].  Cleaning After use, empty and dry the unit with a soft cloth. Warm water and mild detergent may be used occasionally to clean the pump and tubes.  WARNING: The Polar Care Cube can be cold enough to cause serious injury, including full skin necrosis. Follow these Operating Instructions, and carefully read the Product Insert (see pouch on side of unit) and the Cold Therapy Pad Fitting Instructions (provided with each Cold Therapy Pad) prior to use.     PERIPHERAL NERVE BLOCK PATIENT INFORMATION  Your surgeon has requested a peripheral nerve block for your surgery. This anesthetic technique provides excellent post-operative pain relief for you in a safe and effective manner. It will also help reduce the risk of nausea and vomiting and allow earlier discharge from the hospital.   The block is performed under sedation with ultrasound guidance prior to your procedure. Due to the sedation, your may or may not remember the block experience. The nerve block will begin to take effect anywhere from 5 to 30 minutes after being administered. You will be transported to the operating room from your surgery after the block is completed.   At the end of surgery, when the anesthesia wears off, you will notice a few things. Your may not be able to move or feel the part of your body targeted by the nerve block. These are normal experiences, and they will disappear as the block wears off.  If you had an interscalene nerve block performed (which is common for shoulder surgery), your voice can be very hoarse and you may feel that you are not able to take as deep a breath as you did before surgery. Some patients may also notice a droopy eyelid on the affected side. These symptoms will resolve once the block wears off.  Pain control: The nerve block technique used is a single injection that can last anywhere from 1-3 days. The duration of the numbness can vary between individuals. After leaving the hospital, it is  important that you begin to take your prescribed pain medication when you start to sense the nerve block wearing off. This will help you avoid unpleasant pain at the time the nerve block wears off, which can sometimes be in the middle of the night. The block will only cover pain in the areas targeted by the nerve block so if you experience surgical pain outside of that area, please take your prescribed pain medication. Management of the "numb area": After a nerve block, you cannot feel pain, pressure, or temperature in the affected area so there is an increased risk for injury. You should take extra care to protect the affected areas until sensation and movement returns. Please take caution to not come in contact with extremely hot or cold items  because you will not be able to sense or protect yourself form the extremes of temperature.  You may experience some persistent numbness after the procedure by most neurological deficits resolve over time and the incidence of serious long term neurological complications attributable to peripheral nerve blocks are relatively uncommon.    Information for Discharge Teaching: EXPAREL (bupivacaine liposome injectable suspension)   Pain relief is important to your recovery. The goal is to control your pain so you can move easier and return to your normal activities as soon as possible after your procedure. Your physician may use several types of medicines to manage pain, swelling, and more.  Your surgeon or anesthesiologist gave you EXPAREL(bupivacaine) to help control your pain after surgery.  EXPAREL is a local anesthetic designed to release slowly over an extended period of time to provide pain relief by numbing the tissue around the surgical site. EXPAREL is designed to release pain medication over time and can control pain for up to 72 hours. Depending on how you respond to EXPAREL, you may require less pain medication during your recovery. EXPAREL can help  reduce or eliminate the need for opioids during the first few days after surgery when pain relief is needed the most. EXPAREL is not an opioid and is not addictive. It does not cause sleepiness or sedation.   Important! A teal colored band has been placed on your arm with the date, time and amount of EXPAREL you have received. Please leave this armband in place for the full 96 hours following administration, and then you may remove the band. If you return to the hospital for any reason within 96 hours following the administration of EXPAREL, the armband provides important information that your health care providers to know, and alerts them that you have received this anesthetic.    Possible side effects of EXPAREL: Temporary loss of sensation or ability to move in the area where medication was injected. Nausea, vomiting, constipation Rarely, numbness and tingling in your mouth or lips, lightheadedness, or anxiety may occur. Call your doctor right away if you think you may be experiencing any of these sensations, or if you have other questions regarding possible side effects.  Follow all other discharge instructions given to you by your surgeon or nurse. Eat a healthy diet and drink plenty of water or other fluids.

## 2024-03-09 NOTE — Anesthesia Procedure Notes (Signed)
 Procedure Name: Intubation Date/Time: 03/09/2024 11:30 AM  Performed by: Barbette Hair, CRNAPre-anesthesia Checklist: Patient identified, Emergency Drugs available, Suction available, Patient being monitored and Timeout performed Patient Re-evaluated:Patient Re-evaluated prior to induction Oxygen Delivery Method: Circle system utilized Preoxygenation: Pre-oxygenation with 100% oxygen Induction Type: IV induction Ventilation: Mask ventilation without difficulty Laryngoscope Size: Mac and 4 Grade View: Grade I Tube type: Oral Tube size: 7.5 mm Number of attempts: 1 Airway Equipment and Method: Stylet Placement Confirmation: ETT inserted through vocal cords under direct vision, CO2 detector, positive ETCO2 and breath sounds checked- equal and bilateral Secured at: 22 cm Tube secured with: Tape Dental Injury: Teeth and Oropharynx as per pre-operative assessment

## 2024-03-09 NOTE — H&P (Signed)
 Paper H&P to be scanned into permanent record. H&P reviewed. No significant changes noted.

## 2024-03-09 NOTE — Progress Notes (Signed)
Assisted [facility] ANMD with right, supraclavicular, ultrasound guided block. Side rails up, monitors on throughout procedure. See vital signs in flow sheet. Tolerated Procedure well.

## 2024-03-12 ENCOUNTER — Encounter: Payer: Self-pay | Admitting: Orthopedic Surgery

## 2024-04-13 ENCOUNTER — Emergency Department

## 2024-04-13 ENCOUNTER — Emergency Department
Admission: EM | Admit: 2024-04-13 | Discharge: 2024-04-13 | Disposition: A | Discharge status: Home / Self Care | Attending: Emergency Medicine | Admitting: Emergency Medicine

## 2024-04-13 ENCOUNTER — Encounter: Payer: Self-pay | Admitting: Emergency Medicine

## 2024-04-13 ENCOUNTER — Other Ambulatory Visit: Payer: Self-pay

## 2024-04-13 DIAGNOSIS — Y92410 Unspecified street and highway as the place of occurrence of the external cause: Secondary | ICD-10-CM | POA: Diagnosis not present

## 2024-04-13 DIAGNOSIS — S2232XA Fracture of one rib, left side, initial encounter for closed fracture: Secondary | ICD-10-CM | POA: Insufficient documentation

## 2024-04-13 DIAGNOSIS — I10 Essential (primary) hypertension: Secondary | ICD-10-CM | POA: Diagnosis not present

## 2024-04-13 DIAGNOSIS — S0990XA Unspecified injury of head, initial encounter: Secondary | ICD-10-CM | POA: Diagnosis not present

## 2024-04-13 DIAGNOSIS — S80812A Abrasion, left lower leg, initial encounter: Secondary | ICD-10-CM | POA: Diagnosis not present

## 2024-04-13 DIAGNOSIS — E119 Type 2 diabetes mellitus without complications: Secondary | ICD-10-CM | POA: Insufficient documentation

## 2024-04-13 DIAGNOSIS — S51812A Laceration without foreign body of left forearm, initial encounter: Secondary | ICD-10-CM | POA: Diagnosis not present

## 2024-04-13 DIAGNOSIS — S80811A Abrasion, right lower leg, initial encounter: Secondary | ICD-10-CM | POA: Insufficient documentation

## 2024-04-13 DIAGNOSIS — S299XXA Unspecified injury of thorax, initial encounter: Secondary | ICD-10-CM | POA: Diagnosis present

## 2024-04-13 NOTE — ED Notes (Signed)
 Skin tears cleaned and wrapped with clean nonadherent dressing and gauze. Education provided.
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
## 2024-04-13 NOTE — ED Notes (Signed)
 Incentive spirometer provided with education to pt.

## 2024-04-13 NOTE — Discharge Instructions (Addendum)
 You may apply ice 20 minutes every hour for the next 2 days then transition to heat for left rib pain.  You may take oxycodone  2.5 mg to 5 mg every 8 hours as needed for moderate to severe pain.  Use Tylenol  1000 mg every 6-8 hours for mild pain.  Follow-up with primary care provider or orthopedist if persistent pain over the next week that is not improving.  Return to the ER for any shortness of breath worsening symptoms or any urgent changes in health.

## 2024-04-19 ENCOUNTER — Ambulatory Visit
Admission: RE | Admit: 2024-04-19 | Discharge: 2024-04-19 | Disposition: A | Discharge status: Home / Self Care | Source: Ambulatory Visit | Attending: Internal Medicine | Admitting: Internal Medicine

## 2024-04-19 ENCOUNTER — Other Ambulatory Visit: Payer: Self-pay | Admitting: Internal Medicine

## 2024-04-19 DIAGNOSIS — R600 Localized edema: Secondary | ICD-10-CM | POA: Diagnosis present

## 2024-09-11 ENCOUNTER — Other Ambulatory Visit: Payer: Self-pay | Admitting: Internal Medicine

## 2024-09-11 DIAGNOSIS — I824Y2 Acute embolism and thrombosis of unspecified deep veins of left proximal lower extremity: Secondary | ICD-10-CM

## 2024-09-14 ENCOUNTER — Ambulatory Visit
Admission: RE | Admit: 2024-09-14 | Discharge: 2024-09-14 | Disposition: A | Discharge status: Home / Self Care | Source: Ambulatory Visit | Attending: Internal Medicine | Admitting: Internal Medicine

## 2024-09-14 DIAGNOSIS — I824Y2 Acute embolism and thrombosis of unspecified deep veins of left proximal lower extremity: Secondary | ICD-10-CM | POA: Diagnosis present

## 2024-10-25 LAB — COLOGUARD: COLOGUARD: NEGATIVE

## 2025-01-01 ENCOUNTER — Encounter: Payer: Self-pay | Admitting: Orthopedic Surgery
